# Patient Record
Sex: Male | Born: 1950 | Race: White | Hispanic: No | Marital: Married | Smoking: Former smoker
Health system: Southern US, Community
[De-identification: ages and names within clinical notes are randomized; demographics above are authoritative.]

## PROBLEM LIST (undated history)

## (undated) DIAGNOSIS — N400 Enlarged prostate without lower urinary tract symptoms: Secondary | ICD-10-CM

## (undated) DIAGNOSIS — B029 Zoster without complications: Secondary | ICD-10-CM

## (undated) DIAGNOSIS — I2510 Atherosclerotic heart disease of native coronary artery without angina pectoris: Secondary | ICD-10-CM

## (undated) DIAGNOSIS — Z8719 Personal history of other diseases of the digestive system: Secondary | ICD-10-CM

## (undated) DIAGNOSIS — I10 Essential (primary) hypertension: Secondary | ICD-10-CM

## (undated) DIAGNOSIS — R200 Anesthesia of skin: Secondary | ICD-10-CM

## (undated) DIAGNOSIS — R7303 Prediabetes: Secondary | ICD-10-CM

## (undated) DIAGNOSIS — G4730 Sleep apnea, unspecified: Secondary | ICD-10-CM

## (undated) DIAGNOSIS — K219 Gastro-esophageal reflux disease without esophagitis: Secondary | ICD-10-CM

## (undated) DIAGNOSIS — Z789 Other specified health status: Secondary | ICD-10-CM

## (undated) DIAGNOSIS — IMO0001 Reserved for inherently not codable concepts without codable children: Secondary | ICD-10-CM

## (undated) DIAGNOSIS — K5790 Diverticulosis of intestine, part unspecified, without perforation or abscess without bleeding: Secondary | ICD-10-CM

## (undated) DIAGNOSIS — Z0282 Encounter for adoption services: Secondary | ICD-10-CM

## (undated) DIAGNOSIS — I700 Atherosclerosis of aorta: Secondary | ICD-10-CM

## (undated) DIAGNOSIS — M1990 Unspecified osteoarthritis, unspecified site: Secondary | ICD-10-CM

## (undated) DIAGNOSIS — R011 Cardiac murmur, unspecified: Secondary | ICD-10-CM

## (undated) HISTORY — PX: EYE SURGERY: SHX253

## (undated) HISTORY — PX: KNEE ARTHROPLASTY: SHX992

## (undated) HISTORY — PX: TONSILLECTOMY: SUR1361

## (undated) HISTORY — PX: ROTATOR CUFF REPAIR: SHX139

## (undated) HISTORY — PX: BACK SURGERY: SHX140

## (undated) HISTORY — PX: JOINT REPLACEMENT: SHX530

---

## 2005-11-23 ENCOUNTER — Inpatient Hospital Stay: Payer: Self-pay | Admitting: Internal Medicine

## 2007-03-29 ENCOUNTER — Ambulatory Visit: Payer: Self-pay | Admitting: Orthopaedic Surgery

## 2012-06-13 ENCOUNTER — Ambulatory Visit: Payer: Self-pay | Admitting: Family Medicine

## 2012-09-27 ENCOUNTER — Ambulatory Visit: Payer: Self-pay | Admitting: Unknown Physician Specialty

## 2012-10-28 ENCOUNTER — Ambulatory Visit: Payer: Self-pay | Admitting: Unknown Physician Specialty

## 2012-10-28 ENCOUNTER — Ambulatory Visit: Payer: Self-pay | Admitting: Anesthesiology

## 2012-10-28 LAB — ELECTROLYTE PANEL
Co2: 29 mmol/L (ref 21–32)
Potassium: 3.7 mmol/L (ref 3.5–5.1)
Sodium: 141 mmol/L (ref 136–145)

## 2013-02-13 ENCOUNTER — Ambulatory Visit: Payer: Self-pay | Admitting: Unknown Physician Specialty

## 2013-07-17 DIAGNOSIS — M549 Dorsalgia, unspecified: Secondary | ICD-10-CM | POA: Diagnosis present

## 2014-06-13 DIAGNOSIS — M1712 Unilateral primary osteoarthritis, left knee: Secondary | ICD-10-CM | POA: Diagnosis present

## 2014-10-18 ENCOUNTER — Ambulatory Visit: Payer: Self-pay | Admitting: Unknown Physician Specialty

## 2015-03-07 ENCOUNTER — Other Ambulatory Visit: Payer: Self-pay | Admitting: Unknown Physician Specialty

## 2015-03-07 DIAGNOSIS — R29898 Other symptoms and signs involving the musculoskeletal system: Secondary | ICD-10-CM

## 2015-03-07 DIAGNOSIS — M47812 Spondylosis without myelopathy or radiculopathy, cervical region: Secondary | ICD-10-CM

## 2015-03-14 ENCOUNTER — Ambulatory Visit
Admission: RE | Admit: 2015-03-14 | Discharge: 2015-03-14 | Disposition: A | Discharge status: Home / Self Care | Payer: Managed Care, Other (non HMO) | Source: Ambulatory Visit | Attending: Unknown Physician Specialty | Admitting: Unknown Physician Specialty

## 2015-03-14 DIAGNOSIS — M47892 Other spondylosis, cervical region: Secondary | ICD-10-CM | POA: Diagnosis not present

## 2015-03-14 DIAGNOSIS — M47812 Spondylosis without myelopathy or radiculopathy, cervical region: Secondary | ICD-10-CM

## 2015-03-14 DIAGNOSIS — R200 Anesthesia of skin: Secondary | ICD-10-CM | POA: Diagnosis present

## 2015-03-14 DIAGNOSIS — R29898 Other symptoms and signs involving the musculoskeletal system: Secondary | ICD-10-CM

## 2015-03-14 DIAGNOSIS — M438X2 Other specified deforming dorsopathies, cervical region: Secondary | ICD-10-CM | POA: Diagnosis not present

## 2015-03-14 DIAGNOSIS — M6281 Muscle weakness (generalized): Secondary | ICD-10-CM | POA: Diagnosis present

## 2015-05-23 ENCOUNTER — Other Ambulatory Visit: Payer: Self-pay | Admitting: Neurosurgery

## 2015-07-22 NOTE — Pre-Procedure Instructions (Signed)
    KOUA DEEG  07/22/2015      Your procedure is scheduled on Tuesday, October 11.  Report to [facility] Admitting at 8:05 A.M.   Call this number if you have problems the morning of [phone]                   For any other questions, please call [phone], Monday - Friday 8 AM - 4 PM.   Remember:  Do not eat food or drink liquids after midnight.  Take these medicines the morning of surgery with A SIP OF WATER -    Do not wear jewelry, make-up or nail polish.   Do not wear lotions, powders, or perfume.               Men may shave face and neck.   Do not bring valuables to the hospital.   [facility] is not responsible for any belongings or valuables.  Contacts, dentures or bridgework may not be worn into surgery.  Leave your suitcase in the car.  After surgery it may be brought to your room.  For patients admitted to the hospital, discharge time will be determined by your treatment team.  Patients discharged the day of surgery will not be allowed to drive home.   Name and phone number of your driver:   -  Special instructions: Review  [facility] - Preparing For Surgery.  Please read over the following fact sheets that you were given. Pain Booklet, Coughing and Deep Breathing, Blood Transfusion Information and Surgical Site Infection Prevention

## 2015-07-23 ENCOUNTER — Encounter (HOSPITAL_COMMUNITY)
Admission: RE | Admit: 2015-07-23 | Discharge: 2015-07-23 | Disposition: A | Discharge status: Home / Self Care | Payer: Managed Care, Other (non HMO) | Source: Ambulatory Visit | Attending: Neurosurgery | Admitting: Neurosurgery

## 2015-07-23 ENCOUNTER — Encounter (HOSPITAL_COMMUNITY): Payer: Self-pay

## 2015-07-23 DIAGNOSIS — Z01818 Encounter for other preprocedural examination: Secondary | ICD-10-CM | POA: Insufficient documentation

## 2015-07-23 DIAGNOSIS — M5020 Other cervical disc displacement, unspecified cervical region: Secondary | ICD-10-CM | POA: Diagnosis not present

## 2015-07-23 HISTORY — DX: Gastro-esophageal reflux disease without esophagitis: K21.9

## 2015-07-23 HISTORY — DX: Sleep apnea, unspecified: G47.30

## 2015-07-23 HISTORY — DX: Cardiac murmur, unspecified: R01.1

## 2015-07-23 HISTORY — DX: Essential (primary) hypertension: I10

## 2015-07-23 HISTORY — DX: Reserved for inherently not codable concepts without codable children: IMO0001

## 2015-07-23 LAB — BASIC METABOLIC PANEL
Anion gap: 10 (ref 5–15)
BUN: 19 mg/dL (ref 6–20)
CO2: 24 mmol/L (ref 22–32)
CREATININE: 1.06 mg/dL (ref 0.61–1.24)
Calcium: 9.1 mg/dL (ref 8.9–10.3)
Chloride: 104 mmol/L (ref 101–111)
GFR calc Af Amer: >60 mL/min (ref >60)
GLUCOSE: 181 mg/dL — AB (ref 65–99)
Potassium: 4.3 mmol/L (ref 3.5–5.1)
SODIUM: 138 mmol/L (ref 135–145)

## 2015-07-23 LAB — CBC
HCT: 41.5 % (ref 39.0–52.0)
Hemoglobin: 14.3 g/dL (ref 13.0–17.0)
MCH: 29.3 pg (ref 26.0–34.0)
MCHC: 34.5 g/dL (ref 30.0–36.0)
MCV: 85 fL (ref 78.0–100.0)
Platelets: 322 K/uL (ref 150–400)
RBC: 4.88 MIL/uL (ref 4.22–5.81)
RDW: 13.5 % (ref 11.5–15.5)
WBC: 8.4 K/uL (ref 4.0–10.5)

## 2015-07-23 LAB — SURGICAL PCR SCREEN
MRSA, PCR: NEGATIVE
STAPHYLOCOCCUS AUREUS: NEGATIVE

## 2015-07-23 NOTE — Pre-Procedure Instructions (Signed)
    Joel Owens  07/23/2015      Your procedure is scheduled on Tuesday, October 11.  Report to [facility] Admitting at 8:05 A.M.   Call this number if you have problems the morning of surgery:[phone]                   For any other questions, please call [phone], Monday - Friday 8 AM - 4 PM.   Remember:  Do not eat food or drink liquids after midnight.  Take these medicines the morning of surgery with A SIP OF WATER -   AMLODIPINE (NORVASC), NEXIUM,  FLOMAX    Do not wear jewelry, make-up or nail polish.   Do not wear lotions, powders, or perfume.               Men may shave face and neck.   Do not bring valuables to the hospital.   [facility] is not responsible for any belongings or valuables.  Contacts, dentures or bridgework may not be worn into surgery.  Leave your suitcase in the car.  After surgery it may be brought to your room.  For patients admitted to the hospital, discharge time will be determined by your treatment team.  Patients discharged the day of surgery will not be allowed to drive home.   Name and phone number of your driver:   -  Special instructions: Review  [place] - Preparing For Surgery.  Please read over the following fact sheets that you were given. Pain Booklet, Coughing and Deep Breathing, Blood Transfusion Information and Surgical Site Infection Prevention

## 2015-07-24 NOTE — Progress Notes (Signed)
Anesthesia Chart Review: Patient is a 64 year old male scheduled for C4-5, C5-6, C6-7 ACDF on 10/11/6 by Dr. Venetia Maxon.  History includes former smoker,GERD, childhood murmur ("no problems"), OSA (no CPAP), HTN, exertional dyspnea, bilateral TKA, back surgery. BMI is 39.5 consistent with obesity/borderline morbid obesity. PCP is listed as Dr. Angus Palms ([facility]; see Care Everywhere).  Meds include amlodipine, Nexium, losartan, HCTZ, Zantac, Flomax, tramadol.   07/23/15 EKG: unusual P axis, possible ectopic atrial rhythm, LAD, minimal voltage for LVH, may be normal variant. Since tracing on 10/28/12, low atrial rhythm is new.   Preoperative labs noted. Non-fasting glucose 181. No known DM history. Will order a fasting CBG on arrival to get a better baseline. Further recommendations per surgeon and/or anesthesiologists pending results. Since PAT glucose was < 200 I am anticipating his fasting CBG will be as well.   Above including EKG reviewed with anesthesiologist Dr. Krista Blue. If no acute changes and remains asymptomatic from a CV standpoint then I would anticipate that he could proceed as planned.  Velna Ochs [facility] Short Stay Center/Anesthesiology Phone [phone] 07/24/2015 4:15 PM

## 2015-07-29 MED ORDER — DEXTROSE 5 % IV SOLN
3.0000 g | INTRAVENOUS | Status: AC
  Administered 2015-07-30: 3 g via INTRAVENOUS
  Filled 2015-07-29: qty 3000

## 2015-07-30 ENCOUNTER — Inpatient Hospital Stay (HOSPITAL_COMMUNITY): Payer: Managed Care, Other (non HMO) | Admitting: Emergency Medicine

## 2015-07-30 ENCOUNTER — Inpatient Hospital Stay (HOSPITAL_COMMUNITY): Payer: Managed Care, Other (non HMO) | Admitting: Anesthesiology

## 2015-07-30 ENCOUNTER — Inpatient Hospital Stay (HOSPITAL_COMMUNITY)
Admission: RE | Admit: 2015-07-30 | Discharge: 2015-07-31 | DRG: 473 | Disposition: A | Discharge status: Home / Self Care | Payer: Managed Care, Other (non HMO) | Source: Ambulatory Visit | Attending: Neurosurgery | Admitting: Neurosurgery

## 2015-07-30 ENCOUNTER — Inpatient Hospital Stay (HOSPITAL_COMMUNITY): Payer: Managed Care, Other (non HMO)

## 2015-07-30 ENCOUNTER — Encounter (HOSPITAL_COMMUNITY)
Admission: RE | Disposition: A | Discharge status: Home / Self Care | Payer: Self-pay | Source: Ambulatory Visit | Attending: Neurosurgery

## 2015-07-30 ENCOUNTER — Encounter (HOSPITAL_COMMUNITY): Payer: Self-pay | Admitting: General Practice

## 2015-07-30 DIAGNOSIS — M542 Cervicalgia: Secondary | ICD-10-CM

## 2015-07-30 DIAGNOSIS — M5020 Other cervical disc displacement, unspecified cervical region: Secondary | ICD-10-CM | POA: Diagnosis present

## 2015-07-30 DIAGNOSIS — M47892 Other spondylosis, cervical region: Secondary | ICD-10-CM | POA: Diagnosis present

## 2015-07-30 DIAGNOSIS — Z6839 Body mass index (BMI) 39.0-39.9, adult: Secondary | ICD-10-CM

## 2015-07-30 DIAGNOSIS — I10 Essential (primary) hypertension: Secondary | ICD-10-CM | POA: Diagnosis present

## 2015-07-30 DIAGNOSIS — N400 Enlarged prostate without lower urinary tract symptoms: Secondary | ICD-10-CM | POA: Diagnosis present

## 2015-07-30 DIAGNOSIS — M50121 Cervical disc disorder at C4-C5 level with radiculopathy: Principal | ICD-10-CM | POA: Diagnosis present

## 2015-07-30 DIAGNOSIS — M4802 Spinal stenosis, cervical region: Secondary | ICD-10-CM | POA: Diagnosis present

## 2015-07-30 DIAGNOSIS — Z96653 Presence of artificial knee joint, bilateral: Secondary | ICD-10-CM | POA: Diagnosis present

## 2015-07-30 DIAGNOSIS — M50123 Cervical disc disorder at C6-C7 level with radiculopathy: Secondary | ICD-10-CM | POA: Diagnosis present

## 2015-07-30 HISTORY — PX: ANTERIOR CERVICAL DECOMP/DISCECTOMY FUSION: SHX1161

## 2015-07-30 LAB — GLUCOSE, CAPILLARY: Glucose-Capillary: 127 mg/dL — ABNORMAL HIGH (ref 65–99)

## 2015-07-30 SURGERY — ANTERIOR CERVICAL DECOMPRESSION/DISCECTOMY FUSION 3 LEVELS
Anesthesia: General | Site: Neck

## 2015-07-30 MED ORDER — HYDROMORPHONE HCL 1 MG/ML IJ SOLN: 0.5000 mg | INTRAMUSCULAR | Status: DC | PRN

## 2015-07-30 MED ORDER — DEXAMETHASONE SODIUM PHOSPHATE 10 MG/ML IJ SOLN
INTRAMUSCULAR | Status: DC | PRN
  Administered 2015-07-30: 10 mg via INTRAVENOUS

## 2015-07-30 MED ORDER — FENTANYL CITRATE (PF) 250 MCG/5ML IJ SOLN
INTRAMUSCULAR | Status: DC | PRN
  Administered 2015-07-30: 50 ug via INTRAVENOUS
  Administered 2015-07-30: 100 ug via INTRAVENOUS
  Administered 2015-07-30: 50 ug via INTRAVENOUS
  Administered 2015-07-30: 150 ug via INTRAVENOUS
  Administered 2015-07-30: 50 ug via INTRAVENOUS

## 2015-07-30 MED ORDER — SODIUM CHLORIDE 0.9 % IJ SOLN
3.0000 mL | Freq: Two times a day (BID) | INTRAMUSCULAR | Status: DC
  Administered 2015-07-30: 3 mL via INTRAVENOUS

## 2015-07-30 MED ORDER — METHOCARBAMOL 500 MG PO TABS
500.0000 mg | ORAL_TABLET | Freq: Four times a day (QID) | ORAL | Status: DC | PRN
  Administered 2015-07-30 – 2015-07-31 (×2): 500 mg via ORAL
  Filled 2015-07-30: qty 1

## 2015-07-30 MED ORDER — ONDANSETRON HCL 4 MG/2ML IJ SOLN
INTRAMUSCULAR | Status: DC | PRN
  Administered 2015-07-30: 4 mg via INTRAVENOUS

## 2015-07-30 MED ORDER — PROPOFOL 10 MG/ML IV BOLUS
INTRAVENOUS | Status: DC | PRN
  Administered 2015-07-30: 180 mg via INTRAVENOUS

## 2015-07-30 MED ORDER — GLYCOPYRROLATE 0.2 MG/ML IJ SOLN
INTRAMUSCULAR | Status: AC
  Filled 2015-07-30: qty 2

## 2015-07-30 MED ORDER — ACETAMINOPHEN 325 MG PO TABS: 650.0000 mg | ORAL_TABLET | ORAL | Status: DC | PRN

## 2015-07-30 MED ORDER — ADULT MULTIVITAMIN W/MINERALS CH
1.0000 | ORAL_TABLET | Freq: Every day | ORAL | Status: DC
  Filled 2015-07-30: qty 1

## 2015-07-30 MED ORDER — HYDROCODONE-ACETAMINOPHEN 5-325 MG PO TABS: 1.0000 | ORAL_TABLET | ORAL | Status: DC | PRN

## 2015-07-30 MED ORDER — CEFAZOLIN SODIUM 10 G IJ SOLR
3.0000 g | Freq: Three times a day (TID) | INTRAMUSCULAR | Status: AC
  Administered 2015-07-30 (×2): 3 g via INTRAVENOUS
  Filled 2015-07-30 (×3): qty 3000

## 2015-07-30 MED ORDER — PHENOL 1.4 % MT LIQD: 1.0000 | OROMUCOSAL | Status: DC | PRN

## 2015-07-30 MED ORDER — OXYCODONE HCL 5 MG PO TABS
5.0000 mg | ORAL_TABLET | Freq: Once | ORAL | Status: AC | PRN
  Administered 2015-07-30: 5 mg via ORAL

## 2015-07-30 MED ORDER — ONDANSETRON HCL 4 MG/2ML IJ SOLN: 4.0000 mg | INTRAMUSCULAR | Status: DC | PRN

## 2015-07-30 MED ORDER — MIDAZOLAM HCL 2 MG/2ML IJ SOLN
INTRAMUSCULAR | Status: AC
  Filled 2015-07-30: qty 4

## 2015-07-30 MED ORDER — ACETAMINOPHEN 160 MG/5ML PO SOLN
325.0000 mg | ORAL | Status: DC | PRN
  Filled 2015-07-30: qty 20.3

## 2015-07-30 MED ORDER — FAMOTIDINE 20 MG PO TABS
10.0000 mg | ORAL_TABLET | Freq: Two times a day (BID) | ORAL | Status: DC
  Filled 2015-07-30: qty 1

## 2015-07-30 MED ORDER — METHOCARBAMOL 500 MG PO TABS
ORAL_TABLET | ORAL | Status: AC
  Filled 2015-07-30: qty 1

## 2015-07-30 MED ORDER — METHOCARBAMOL 1000 MG/10ML IJ SOLN
500.0000 mg | Freq: Four times a day (QID) | INTRAVENOUS | Status: DC | PRN
  Filled 2015-07-30: qty 5

## 2015-07-30 MED ORDER — MIDAZOLAM HCL 2 MG/2ML IJ SOLN
INTRAMUSCULAR | Status: AC
  Filled 2015-07-30: qty 2

## 2015-07-30 MED ORDER — ROCURONIUM BROMIDE 100 MG/10ML IV SOLN
INTRAVENOUS | Status: DC | PRN
  Administered 2015-07-30: 50 mg via INTRAVENOUS

## 2015-07-30 MED ORDER — FENTANYL CITRATE (PF) 250 MCG/5ML IJ SOLN
INTRAMUSCULAR | Status: AC
  Filled 2015-07-30: qty 5

## 2015-07-30 MED ORDER — OXYCODONE HCL 5 MG PO TABS
ORAL_TABLET | ORAL | Status: AC
  Filled 2015-07-30: qty 1

## 2015-07-30 MED ORDER — ONDANSETRON HCL 4 MG/2ML IJ SOLN
INTRAMUSCULAR | Status: AC
  Filled 2015-07-30: qty 2

## 2015-07-30 MED ORDER — ACETAMINOPHEN 650 MG RE SUPP: 650.0000 mg | RECTAL | Status: DC | PRN

## 2015-07-30 MED ORDER — POLYETHYLENE GLYCOL 3350 17 G PO PACK: 17.0000 g | PACK | Freq: Every day | ORAL | Status: DC | PRN

## 2015-07-30 MED ORDER — BISACODYL 10 MG RE SUPP: 10.0000 mg | Freq: Every day | RECTAL | Status: DC | PRN

## 2015-07-30 MED ORDER — ALUM & MAG HYDROXIDE-SIMETH 200-200-20 MG/5ML PO SUSP
30.0000 mL | Freq: Four times a day (QID) | ORAL | Status: DC | PRN
  Administered 2015-07-31: 30 mL via ORAL
  Filled 2015-07-30: qty 30

## 2015-07-30 MED ORDER — DOCUSATE SODIUM 100 MG PO CAPS
100.0000 mg | ORAL_CAPSULE | Freq: Two times a day (BID) | ORAL | Status: DC
  Filled 2015-07-30: qty 1

## 2015-07-30 MED ORDER — SODIUM CHLORIDE 0.9 % IJ SOLN: 3.0000 mL | INTRAMUSCULAR | Status: DC | PRN

## 2015-07-30 MED ORDER — VITAMIN D 1000 UNITS PO TABS
5000.0000 [IU] | ORAL_TABLET | Freq: Every day | ORAL | Status: DC
  Filled 2015-07-30: qty 5

## 2015-07-30 MED ORDER — MENTHOL 3 MG MT LOZG: 1.0000 | LOZENGE | OROMUCOSAL | Status: DC | PRN

## 2015-07-30 MED ORDER — MIDAZOLAM HCL 5 MG/5ML IJ SOLN
INTRAMUSCULAR | Status: DC | PRN
  Administered 2015-07-30: 2 mg via INTRAVENOUS

## 2015-07-30 MED ORDER — LIDOCAINE HCL (CARDIAC) 20 MG/ML IV SOLN
INTRAVENOUS | Status: DC | PRN
  Administered 2015-07-30: 80 mg via INTRAVENOUS

## 2015-07-30 MED ORDER — VITAMIN C 500 MG PO TABS
500.0000 mg | ORAL_TABLET | Freq: Every day | ORAL | Status: DC
  Filled 2015-07-30 (×2): qty 1

## 2015-07-30 MED ORDER — LOSARTAN POTASSIUM 50 MG PO TABS
50.0000 mg | ORAL_TABLET | Freq: Every day | ORAL | Status: DC
  Administered 2015-07-30 – 2015-07-31 (×2): 50 mg via ORAL
  Filled 2015-07-30 (×2): qty 1

## 2015-07-30 MED ORDER — TAMSULOSIN HCL 0.4 MG PO CAPS
0.4000 mg | ORAL_CAPSULE | Freq: Every day | ORAL | Status: DC
  Administered 2015-07-31: 0.4 mg via ORAL
  Filled 2015-07-30: qty 1

## 2015-07-30 MED ORDER — NEOSTIGMINE METHYLSULFATE 10 MG/10ML IV SOLN
INTRAVENOUS | Status: AC
  Filled 2015-07-30: qty 1

## 2015-07-30 MED ORDER — ACETAMINOPHEN 325 MG PO TABS: 325.0000 mg | ORAL_TABLET | ORAL | Status: DC | PRN

## 2015-07-30 MED ORDER — ZOLPIDEM TARTRATE 5 MG PO TABS: 5.0000 mg | ORAL_TABLET | Freq: Every evening | ORAL | Status: DC | PRN

## 2015-07-30 MED ORDER — LIDOCAINE-EPINEPHRINE 1 %-1:100000 IJ SOLN
INTRAMUSCULAR | Status: DC | PRN
  Administered 2015-07-30: 5 mL

## 2015-07-30 MED ORDER — NEOSTIGMINE METHYLSULFATE 10 MG/10ML IV SOLN
INTRAVENOUS | Status: DC | PRN
  Administered 2015-07-30: 3 mg via INTRAVENOUS

## 2015-07-30 MED ORDER — PANTOPRAZOLE SODIUM 40 MG PO TBEC
40.0000 mg | DELAYED_RELEASE_TABLET | Freq: Every day | ORAL | Status: DC
  Administered 2015-07-31: 40 mg via ORAL
  Filled 2015-07-30 (×2): qty 1

## 2015-07-30 MED ORDER — HYDROMORPHONE HCL 1 MG/ML IJ SOLN
INTRAMUSCULAR | Status: AC
  Filled 2015-07-30: qty 1

## 2015-07-30 MED ORDER — PROPOFOL 10 MG/ML IV BOLUS
INTRAVENOUS | Status: AC
  Filled 2015-07-30: qty 20

## 2015-07-30 MED ORDER — TRIAMCINOLONE ACETONIDE 0.1 % EX CREA
1.0000 "application " | TOPICAL_CREAM | Freq: Two times a day (BID) | CUTANEOUS | Status: DC | PRN
  Filled 2015-07-30: qty 15

## 2015-07-30 MED ORDER — 0.9 % SODIUM CHLORIDE (POUR BTL) OPTIME
TOPICAL | Status: DC | PRN
  Administered 2015-07-30: 1000 mL

## 2015-07-30 MED ORDER — PANTOPRAZOLE SODIUM 40 MG IV SOLR: 40.0000 mg | Freq: Every evening | INTRAVENOUS | Status: DC | PRN

## 2015-07-30 MED ORDER — THROMBIN 5000 UNITS EX SOLR
CUTANEOUS | Status: DC | PRN
  Administered 2015-07-30: 10 mL via TOPICAL

## 2015-07-30 MED ORDER — HYDROMORPHONE HCL 1 MG/ML IJ SOLN
0.2500 mg | INTRAMUSCULAR | Status: DC | PRN
  Administered 2015-07-30 (×4): 0.5 mg via INTRAVENOUS

## 2015-07-30 MED ORDER — HYDROCHLOROTHIAZIDE 25 MG PO TABS
25.0000 mg | ORAL_TABLET | Freq: Every day | ORAL | Status: DC
  Administered 2015-07-30 – 2015-07-31 (×2): 25 mg via ORAL
  Filled 2015-07-30 (×2): qty 1

## 2015-07-30 MED ORDER — OXYCODONE HCL 5 MG/5ML PO SOLN: 5.0000 mg | Freq: Once | ORAL | Status: AC | PRN

## 2015-07-30 MED ORDER — GLYCOPYRROLATE 0.2 MG/ML IJ SOLN
INTRAMUSCULAR | Status: DC | PRN
  Administered 2015-07-30: 0.4 mg via INTRAVENOUS

## 2015-07-30 MED ORDER — OXYCODONE-ACETAMINOPHEN 5-325 MG PO TABS
1.0000 | ORAL_TABLET | ORAL | Status: DC | PRN
  Administered 2015-07-30 – 2015-07-31 (×3): 2 via ORAL
  Filled 2015-07-30 (×3): qty 2

## 2015-07-30 MED ORDER — LACTATED RINGERS IV SOLN
INTRAVENOUS | Status: DC
  Administered 2015-07-30 (×2): via INTRAVENOUS

## 2015-07-30 MED ORDER — THROMBIN 20000 UNITS EX SOLR
CUTANEOUS | Status: DC | PRN
  Administered 2015-07-30: 20 mL via TOPICAL

## 2015-07-30 MED ORDER — BUPIVACAINE HCL (PF) 0.5 % IJ SOLN
INTRAMUSCULAR | Status: DC | PRN
  Administered 2015-07-30: 5 mL

## 2015-07-30 MED ORDER — FLEET ENEMA 7-19 GM/118ML RE ENEM: 1.0000 | ENEMA | Freq: Once | RECTAL | Status: DC | PRN

## 2015-07-30 MED ORDER — TRAMADOL HCL 50 MG PO TABS: 50.0000 mg | ORAL_TABLET | ORAL | Status: DC | PRN

## 2015-07-30 MED ORDER — KCL IN DEXTROSE-NACL 20-5-0.45 MEQ/L-%-% IV SOLN
INTRAVENOUS | Status: DC
  Filled 2015-07-30 (×3): qty 1000

## 2015-07-30 MED ORDER — AMLODIPINE BESYLATE 10 MG PO TABS
10.0000 mg | ORAL_TABLET | Freq: Every day | ORAL | Status: DC
  Filled 2015-07-30: qty 1

## 2015-07-30 SURGICAL SUPPLY — 70 items
BENZOIN TINCTURE PRP APPL 2/3 (GAUZE/BANDAGES/DRESSINGS) IMPLANT
BIT DRILL NEURO 2X3.1 SFT TUCH (MISCELLANEOUS) ×1 IMPLANT
BIT DRILL POWER (BIT) ×1 IMPLANT
BLADE ULTRA TIP 2M (BLADE) IMPLANT
BNDG GAUZE ELAST 4 BULKY (GAUZE/BANDAGES/DRESSINGS) IMPLANT
BUR BARREL STRAIGHT FLUTE 4.0 (BURR) ×3 IMPLANT
CAGE SMALL 7X13X15 (Cage) ×9 IMPLANT
CANISTER SUCT 3000ML PPV (MISCELLANEOUS) ×3 IMPLANT
CLOSURE WOUND 1/2 X4 (GAUZE/BANDAGES/DRESSINGS)
COVER MAYO STAND STRL (DRAPES) ×3 IMPLANT
DECANTER SPIKE VIAL GLASS SM (MISCELLANEOUS) ×3 IMPLANT
DERMABOND ADHESIVE PROPEN (GAUZE/BANDAGES/DRESSINGS) ×2
DERMABOND ADVANCED (GAUZE/BANDAGES/DRESSINGS) ×2
DERMABOND ADVANCED .7 DNX12 (GAUZE/BANDAGES/DRESSINGS) ×1 IMPLANT
DERMABOND ADVANCED .7 DNX6 (GAUZE/BANDAGES/DRESSINGS) ×1 IMPLANT
DRAPE LAPAROTOMY 100X72 PEDS (DRAPES) ×3 IMPLANT
DRAPE MICROSCOPE LEICA (MISCELLANEOUS) ×3 IMPLANT
DRAPE POUCH INSTRU U-SHP 10X18 (DRAPES) ×3 IMPLANT
DRAPE PROXIMA HALF (DRAPES) IMPLANT
DRILL BIT POWER (BIT) ×2
DRILL NEURO 2X3.1 SOFT TOUCH (MISCELLANEOUS) ×3
DURAPREP 6ML APPLICATOR 50/CS (WOUND CARE) ×3 IMPLANT
ELECT COATED BLADE 2.86 ST (ELECTRODE) ×3 IMPLANT
ELECT REM PT RETURN 9FT ADLT (ELECTROSURGICAL) ×3
ELECTRODE REM PT RTRN 9FT ADLT (ELECTROSURGICAL) ×1 IMPLANT
GAUZE SPONGE 4X4 12PLY STRL (GAUZE/BANDAGES/DRESSINGS) IMPLANT
GAUZE SPONGE 4X4 16PLY XRAY LF (GAUZE/BANDAGES/DRESSINGS) IMPLANT
GLOVE BIO SURGEON STRL SZ8 (GLOVE) ×3 IMPLANT
GLOVE BIOGEL PI IND STRL 7.5 (GLOVE) ×1 IMPLANT
GLOVE BIOGEL PI IND STRL 8 (GLOVE) ×1 IMPLANT
GLOVE BIOGEL PI IND STRL 8.5 (GLOVE) ×1 IMPLANT
GLOVE BIOGEL PI INDICATOR 7.5 (GLOVE) ×2
GLOVE BIOGEL PI INDICATOR 8 (GLOVE) ×2
GLOVE BIOGEL PI INDICATOR 8.5 (GLOVE) ×2
GLOVE ECLIPSE 7.5 STRL STRAW (GLOVE) ×3 IMPLANT
GLOVE ECLIPSE 8.0 STRL XLNG CF (GLOVE) ×6 IMPLANT
GLOVE EXAM NITRILE LRG STRL (GLOVE) IMPLANT
GLOVE EXAM NITRILE MD LF STRL (GLOVE) IMPLANT
GLOVE EXAM NITRILE XL STR (GLOVE) IMPLANT
GLOVE EXAM NITRILE XS STR PU (GLOVE) IMPLANT
GOWN STRL REUS W/ TWL LRG LVL3 (GOWN DISPOSABLE) IMPLANT
GOWN STRL REUS W/ TWL XL LVL3 (GOWN DISPOSABLE) ×2 IMPLANT
GOWN STRL REUS W/TWL 2XL LVL3 (GOWN DISPOSABLE) ×3 IMPLANT
GOWN STRL REUS W/TWL LRG LVL3 (GOWN DISPOSABLE)
GOWN STRL REUS W/TWL XL LVL3 (GOWN DISPOSABLE) ×4
HALTER HD/CHIN CERV TRACTION D (MISCELLANEOUS) ×3 IMPLANT
KIT BASIN OR (CUSTOM PROCEDURE TRAY) ×3 IMPLANT
KIT ROOM TURNOVER OR (KITS) ×3 IMPLANT
NEEDLE HYPO 25X1 1.5 SAFETY (NEEDLE) ×3 IMPLANT
NEEDLE SPNL 18GX3.5 QUINCKE PK (NEEDLE) IMPLANT
NEEDLE SPNL 22GX3.5 QUINCKE BK (NEEDLE) ×3 IMPLANT
NS IRRIG 1000ML POUR BTL (IV SOLUTION) ×3 IMPLANT
PACK LAMINECTOMY NEURO (CUSTOM PROCEDURE TRAY) ×3 IMPLANT
PAD ARMBOARD 7.5X6 YLW CONV (MISCELLANEOUS) ×9 IMPLANT
PIN DISTRACTION 14MM (PIN) ×6 IMPLANT
PLATE ARCHON 3-LEVEL 54MM (Plate) ×3 IMPLANT
PUTTY BONE DBX 2.5 MIS (Bone Implant) ×3 IMPLANT
RUBBERBAND STERILE (MISCELLANEOUS) ×6 IMPLANT
SCREW ARCHON SELFTAP 4.0X13 (Screw) ×24 IMPLANT
SPONGE INTESTINAL PEANUT (DISPOSABLE) ×3 IMPLANT
SPONGE SURGIFOAM ABS GEL 100 (HEMOSTASIS) ×3 IMPLANT
STAPLER SKIN PROX WIDE 3.9 (STAPLE) IMPLANT
STRIP CLOSURE SKIN 1/2X4 (GAUZE/BANDAGES/DRESSINGS) IMPLANT
SUT VIC AB 3-0 SH 8-18 (SUTURE) ×3 IMPLANT
SYR 5ML LL (SYRINGE) IMPLANT
TOWEL OR 17X24 6PK STRL BLUE (TOWEL DISPOSABLE) ×3 IMPLANT
TOWEL OR 17X26 10 PK STRL BLUE (TOWEL DISPOSABLE) ×3 IMPLANT
TRAP SPECIMEN MUCOUS 40CC (MISCELLANEOUS) IMPLANT
TRAY FOLEY W/METER SILVER 14FR (SET/KITS/TRAYS/PACK) IMPLANT
WATER STERILE IRR 1000ML POUR (IV SOLUTION) ×3 IMPLANT

## 2015-07-30 NOTE — Anesthesia Procedure Notes (Signed)
Procedure Name: Intubation Date/Time: 07/30/2015 10:58 AM Performed by: Jerilee Hoh Pre-anesthesia Checklist: Patient identified, Emergency Drugs available, Suction available and Patient being monitored Patient Re-evaluated:Patient Re-evaluated prior to inductionOxygen Delivery Method: Circle system utilized Preoxygenation: Pre-oxygenation with 100% oxygen Intubation Type: IV induction Ventilation: Mask ventilation without difficulty and Oral airway inserted - appropriate to patient size Laryngoscope Size: Mac and 4 Grade View: Grade I Tube type: Oral Tube size: 7.5 mm Number of attempts: 1 Airway Equipment and Method: Stylet Placement Confirmation: ETT inserted through vocal cords under direct vision,  positive ETCO2 and breath sounds checked- equal and bilateral Secured at: 23 cm Tube secured with: Tape Dental Injury: Teeth and Oropharynx as per pre-operative assessment

## 2015-07-30 NOTE — Anesthesia Postprocedure Evaluation (Signed)
  Anesthesia Post-op Note  Patient: Joel Owens  Procedure(s) Performed: Procedure(s) with comments: C4-5 C5-6 C6-7 Anterior cervical decompression/diskectomy/fusion (N/A) - C4-5 C5-6 C6-7 Anterior cervical decompression/diskectomy/fusion  Patient Location: PACU  Anesthesia Type:General  Level of Consciousness: awake  Airway and Oxygen Therapy: Patient Spontanous Breathing and Patient connected to nasal cannula oxygen  Post-op Pain: mild  Post-op Assessment: Post-op Vital signs reviewed, Patient's Cardiovascular Status Stable, Respiratory Function Stable, Patent Airway, No signs of Nausea or vomiting and Pain level controlled              Post-op Vital Signs: Reviewed and stable  Last Vitals:  Filed Vitals:   07/30/15 1535  BP:   Pulse: 100  Temp: 37.6 C  Resp: 22    Complications: No apparent anesthesia complications

## 2015-07-30 NOTE — Interval H&P Note (Signed)
History and Physical Interval Note:  07/30/2015 7:24 AM  Joel Owens  has presented today for surgery, with the diagnosis of Cervical herniated disc  The various methods of treatment have been discussed with the patient and family. After consideration of risks, benefits and other options for treatment, the patient has consented to  Procedure(s) with comments: C4-5 C5-6 C6-7 Anterior cervical decompression/diskectomy/fusion (N/A) - C4-5 C5-6 C6-7 Anterior cervical decompression/diskectomy/fusion as a surgical intervention .  The patient's history has been reviewed, patient examined, no change in status, stable for surgery.  I have reviewed the patient's chart and labs.  Questions were answered to the patient's satisfaction.     Adelle Zachar D

## 2015-07-30 NOTE — Op Note (Signed)
07/30/2015  2:15 PM  PATIENT:  Joel Owens  64 y.o. male  PRE-OPERATIVE DIAGNOSIS:  Cervical herniated disc with spondylosis, stenosis, radiculopathy C 45, C 56, C 67 levels  POST-OPERATIVE DIAGNOSIS:  Cervical herniated disc with spondylosis, stenosis, radiculopathy C 45, C 56, C 67 levels  PROCEDURE:  Procedure(s) with comments: C4-5 C5-6 C6-7 Anterior cervical decompression/diskectomy/fusion (N/A) - C4-5 C5-6 C6-7 Anterior cervical decompression/diskectomy/fusion with PEEK cages, autograft, anterior cervical plate  SURGEON:  Surgeon(s) and Role:    * Maeola Harman, MD - Primary    * Julio Sicks, MD - Assisting  PHYSICIAN ASSISTANT:   ASSISTANTS: Poteat, RN   ANESTHESIA:   general  EBL:  Total I/O In: 1500 [I.V.:1500] Out: 100 [Blood:100]  BLOOD ADMINISTERED:none  DRAINS: (10) Jackson-Pratt drain(s) with closed bulb suction in the prevertebral space   LOCAL MEDICATIONS USED:  LIDOCAINE   SPECIMEN:  No Specimen  DISPOSITION OF SPECIMEN:  N/A  COUNTS:  YES  TOURNIQUET:  * No tourniquets in log *  DICTATION: DICTATION: Patient was brought to operating room and following the smooth and uncomplicated induction of general endotracheal anesthesia his head was placed on a horseshoe head holder he was placed in 5 pounds of Holter traction and his anterior neck was prepped and draped in usual sterile fashion. An incision was made on the left side of midline after infiltrating the skin and subcutaneous tissues with local lidocaine. The platysmal layer was incised and subplatysmal dissection was performed exposing the anterior border sternocleidomastoid muscle. Using blunt dissection the carotid sheath was kept lateral and trachea and esophagus kept medial exposing the anterior cervical spine. A bent spinal needle was placed it was felt to be the C4-5 and C5-6 levels and this was confirmed on intraoperative x-ray. Longus coli muscles were taken down from the anterior cervical  spine using electrocautery and key elevator and self-retaining retractor was placed. The interspace at C6-7 was incised and a thorough discectomy was performed. Distraction pins were placed. Uncinate spurs and central spondylitic ridges were drilled down with a high-speed drill. The spinal cord dura and both C7 nerve roots were widely decompressed. There was a considerable amount of spondylotic material which was carefully removed.  Hemostasis was assured. After trial sizing a 7 mm peek interbody cage was selected and packed with morcellized autograft and DBM. The graft was tamped into position and countersunk appropriately. The retractor was moved and the interspace at C5-6 was incised and a thorough discectomy was performed. Distraction pins were placed. Uncinate spurs and central spondylitic ridges were drilled down with a high-speed drill. The spinal cord dura and both C6 nerve roots were widely decompressed. Hemostasis was assured. After trial sizing an 7 mm peek interbody cage was selected and packed in a similar fashiont. The graft was tamped into position and countersunk appropriately.The interspace at C4-5 was incised and a thorough discectomy was performed. Distraction pins were placed. Uncinate spurs and central spondylitic ridges were drilled down with a high-speed drill. The spinal cord dura and both C5 nerve roots were widely decompressed. Hemostasis was assured. After trial sizing a 7 mm peek interbody cage was selected and packed in a similar fashion. The graft was tamped into position and countersunk appropriately.  Distraction weight was removed. A 54 mm Nuvasive Archon anterior cervical plate was affixed to the cervical spine with 13 mm variable-angle screws 2 at C4, 2 at C5, 2 at C6,  and 2 at C7. All screws were well-positioned and locking mechanisms were engaged.  Soft tissues were inspected and found to be in good repair. The wound was irrigated. A final x-ray was obtained with good  visualization at C4 with the interbody graft well visualized. A 10 JP drain was placed. The platysma layer was closed with 3-0 Vicryl stitches and the skin was reapproximated with 3-0 Vicryl subcuticular stitches. The wound was dressed with Dermabond and a sterile occlusive dressing. Counts were correct at the end of the case. Patient was extubated and taken to recovery in stable and satisfactory condition.    PLAN OF CARE: Admit to inpatient   PATIENT DISPOSITION:  PACU - hemodynamically stable.   Delay start of Pharmacological VTE agent (>24hrs) due to surgical blood loss or risk of bleeding: yes  

## 2015-07-30 NOTE — H&P (Signed)
Patient ID:   [id number] Patient: Joel Owens  Date of Birth: [birth year] Visit Type: Office Visit   Date: 05/22/2015 03:30 PM Provider: Danae Orleans. Venetia Maxon MD   This 64 year old male presents for neck pain.  History of Present Illness: 1.  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  Patient recalls lifting a heavy sheet of plywood in October and feeling a pop in his neck.  Since that time he notices while holding objects his arms will just "give way".  He reports only occasional aches in his neck and both arms. He notes one episode of dizziness this morning while driving to work has subsided.   History: HTN, enlarged prostate Surgical history: Lumbar surgery 1980s, left shoulder 2012, right partial knee replacement 2014, left partial knee replacement January 2016  MRI on [place]  MRI was reviewed which shows significant spondylosis and stenosis at the C4 C5, C5 C6, C6 C7 levels.  There appears to be foraminal stenosis as well as central spinal stenosis at these levels.  The patient describes significant neck pain and also numbness and dropping of his arms due to weakness.       PAST MEDICAL HISTORY, SURGICAL HISTORY, FAMILY HISTORY, SOCIAL HISTORY AND REVIEW OF SYSTEMS I have reviewed the patient's past medical, surgical, family and social history as well as the comprehensive review of systems as included on the [facility] history form dated 05/22/2015, which I have signed.   MEDICATIONS(added, continued or stopped this visit):   ALLERGIES: Ingredient Reaction Medication Name Comment  NO KNOWN ALLERGIES     No known allergies.   REVIEW OF SYSTEMS System Neg/Pos Details  Constitutional Negative Chills, fatigue, fever, malaise, night sweats, weight gain and weight loss.  ENMT Negative Ear drainage, hearing loss, nasal drainage,  otalgia, sinus pressure and sore throat.  Eyes Negative Eye discharge, eye pain and vision changes.  Respiratory Negative Chronic cough, cough, dyspnea, known TB exposure and wheezing.  Cardio Negative Chest pain, claudication, edema and irregular heartbeat/palpitations.  GI Negative Abdominal pain, blood in stool, change in stool pattern, constipation, decreased appetite, diarrhea, heartburn, nausea and vomiting.  GU Negative Dribbling, dysuria, erectile dysfunction, hematuria, polyuria, slow stream, urinary frequency, urinary incontinence and urinary retention.  Endocrine Negative Cold intolerance, heat intolerance, polydipsia and polyphagia.  Neuro Negative Dizziness, extremity weakness, gait disturbance, headache, memory impairment, numbness in extremity, seizures and tremors.  Psych Negative Anxiety, depression and insomnia.  Integumentary Negative Brittle hair, brittle nails, change in shape/size of mole(s), hair loss, hirsutism, hives, pruritus, rash and skin lesion.  MS Positive Muscle weakness, Neck pain.  Hema/Lymph Negative Easy bleeding, easy bruising and lymphadenopathy.  Allergic/Immuno Negative Contact allergy, environmental allergies, food allergies and seasonal allergies.  Reproductive Negative Penile discharge and sexual dysfunction.     Vitals Date Temp F BP Pulse Ht In Wt Lb BMI BSA Pain Score  05/22/2015  158/80 88 70 282 40.46  2/10     PHYSICAL EXAM General Level of Distress: no acute distress Overall Appearance: normal    Cardiovascular Cardiac: regular rate and rhythm without murmur  Right Left  Carotid Pulses: normal normal  Respiratory Lungs: clear to auscultation  Neurological Orientation: normal Recent and Remote Memory: normal Attention Span and Concentration:   normal Language: normal Fund of Knowledge: normal  Right Left Sensation: normal normal Upper Extremity Coordination: normal normal  Lower Extremity  Coordination: normal normal  Musculoskeletal Gait and Station: normal  Right Left Upper Extremity Muscle Strength: normal normal Lower Extremity Muscle Strength: normal normal Upper Extremity Muscle Tone:  normal normal Lower Extremity Muscle Tone: normal normal  Motor Strength Upper and lower extremity motor strength was tested in the clinically pertinent muscles. Any abnormal findings will be noted below.   Right Left Deltoid: 4+/5 4+/5 Biceps: 4+/5 4+/5 Triceps: 4/5 4+/5 Grip: 4/5 4/5   Deep Tendon Reflexes  Right Left Biceps: normal normal Triceps: normal normal Brachiloradialis: normal normal Patellar: normal normal Achilles: normal normal  Sensory Sensation was tested at C2 to T1.   Cranial Nerves II. Optic Nerve/Visual Fields: normal III. Oculomotor: normal IV. Trochlear: normal V. Trigeminal: normal VI. Abducens: normal VII. Facial: normal VIII. Acoustic/Vestibular: normal IX. Glossopharyngeal: normal X. Vagus: normal XI. Spinal Accessory: normal XII. Hypoglossal: normal  Motor and other Tests Lhermittes: negative Rhomberg: negative Pronator drift: absent     Right Left Spurlings positive positive Hoffman's: normal normal Clonus: normal normal Babinski: normal normal SLR: negative negative Patrick's Pearlean Brownie): negative negative Toe Walk: normal normal Toe Lift: normal normal Heel Walk: normal normal Tinels Elbow: negative negative Tinels Wrist: negative negative Phalen: negative negative   Additional Findings:  Patient notes tingling into both of his hands but denies numbness to pinprick.    IMPRESSION Patient has cervical stenosis, cord compression, foraminal stenosis affecting the C4 C5, C5 C6, C6 C7 levels.  I recommended that he undergo anterior cervical decompression fusion at the C4 C5, C5 C6, C6 C7 levels.  Wishes to do so.  I do not believe there is a nonsurgical option.  Assessment/Plan # Detail Type Description   1.  Assessment Radiculopathy, cervical region (M54.12).       2. Assessment Cervicalgia (M54.2).       3. Assessment Herniated nucleus pulposus, cervical (M50.20).       4. Assessment Cervical stenosis of spinal canal (M48.02).         Pain Assessment/Treatment Location: neck. Onset: 07/21/2014. Duration: varies. Quality: discomforting. Pain Assessment/Treatment follow-up plan of care: Patient is taking medications as prescribed..  Anterior cervical decompression and fusion C4 C5, C5 C6, C6 C7 levels.  Risks and benefits were discussed in detail with the patient.  Models were reviewed and the surgery and specific was also reviewed in detail.  The patient was fitted for a soft cervical collar.  Orders: Diagnostic Procedures: Assessment Procedure  M50.20 ACDF - C4-C5 - C5-C6 - C6-C7  M50.20 Cervical Spine- Lateral             Provider:  Danae Orleans. Venetia Maxon MD  05/25/2015 02:53 PM Dictation edited by: Joel Owens    CC Providers: [facility] [id number] [place], [place] [address]-              Electronically signed by Joel Orleans. Venetia Maxon MD on 05/25/2015 02:53 PM

## 2015-07-30 NOTE — Progress Notes (Signed)
Awake, alert, conversant.  MAEW with full strength bilateral D/B/T.  Doing well.

## 2015-07-30 NOTE — Anesthesia Preprocedure Evaluation (Signed)
Anesthesia Evaluation  Patient identified by MRN, date of birth, ID band Patient awake    Reviewed: Allergy & Precautions, NPO status , Patient's Chart, lab work & pertinent test results  History of Anesthesia Complications Negative for: history of anesthetic complications  Airway Mallampati: II  TM Distance: >3 FB Neck ROM: Full    Dental  (+) Teeth Intact, Chipped   Pulmonary neg shortness of breath, sleep apnea , neg COPD, former smoker,    breath sounds clear to auscultation       Cardiovascular hypertension, Pt. on medications (-) angina(-) Past MI and (-) CHF  Rhythm:Regular     Neuro/Psych  Neuromuscular disease negative psych ROS   GI/Hepatic Neg liver ROS, GERD  Medicated and Controlled,  Endo/Other  Morbid obesity  Renal/GU negative Renal ROS     Musculoskeletal   Abdominal   Peds  Hematology negative hematology ROS (+)   Anesthesia Other Findings   Reproductive/Obstetrics                             Anesthesia Physical Anesthesia Plan  ASA: II  Anesthesia Plan: General   Post-op Pain Management:    Induction: Intravenous  Airway Management Planned: Oral ETT  Additional Equipment: None  Intra-op Plan:   Post-operative Plan: Extubation in OR  Informed Consent: I have reviewed the patients History and Physical, chart, labs and discussed the procedure including the risks, benefits and alternatives for the proposed anesthesia with the patient or authorized representative who has indicated his/her understanding and acceptance.   Dental advisory given  Plan Discussed with: Surgeon and CRNA  Anesthesia Plan Comments:         Anesthesia Quick Evaluation

## 2015-07-30 NOTE — Brief Op Note (Signed)
07/30/2015  2:15 PM  PATIENT:  Joel Owens  64 y.o. male  PRE-OPERATIVE DIAGNOSIS:  Cervical herniated disc with spondylosis, stenosis, radiculopathy C 45, C 56, C 67 levels  POST-OPERATIVE DIAGNOSIS:  Cervical herniated disc with spondylosis, stenosis, radiculopathy C 45, C 56, C 67 levels  PROCEDURE:  Procedure(s) with comments: C4-5 C5-6 C6-7 Anterior cervical decompression/diskectomy/fusion (N/A) - C4-5 C5-6 C6-7 Anterior cervical decompression/diskectomy/fusion with PEEK cages, autograft, anterior cervical plate  SURGEON:  Surgeon(s) and Role:    * Maeola Harman, MD - Primary    * Julio Sicks, MD - Assisting  PHYSICIAN ASSISTANT:   ASSISTANTS: Poteat, RN   ANESTHESIA:   general  EBL:  Total I/O In: 1500 [I.V.:1500] Out: 100 [Blood:100]  BLOOD ADMINISTERED:none  DRAINS: (10) Jackson-Pratt drain(s) with closed bulb suction in the prevertebral space   LOCAL MEDICATIONS USED:  LIDOCAINE   SPECIMEN:  No Specimen  DISPOSITION OF SPECIMEN:  N/A  COUNTS:  YES  TOURNIQUET:  * No tourniquets in log *  DICTATION: DICTATION: Patient was brought to operating room and following the smooth and uncomplicated induction of general endotracheal anesthesia his head was placed on a horseshoe head holder he was placed in 5 pounds of Holter traction and his anterior neck was prepped and draped in usual sterile fashion. An incision was made on the left side of midline after infiltrating the skin and subcutaneous tissues with local lidocaine. The platysmal layer was incised and subplatysmal dissection was performed exposing the anterior border sternocleidomastoid muscle. Using blunt dissection the carotid sheath was kept lateral and trachea and esophagus kept medial exposing the anterior cervical spine. A bent spinal needle was placed it was felt to be the C4-5 and C5-6 levels and this was confirmed on intraoperative x-ray. Longus coli muscles were taken down from the anterior cervical  spine using electrocautery and key elevator and self-retaining retractor was placed. The interspace at C6-7 was incised and a thorough discectomy was performed. Distraction pins were placed. Uncinate spurs and central spondylitic ridges were drilled down with a high-speed drill. The spinal cord dura and both C7 nerve roots were widely decompressed. There was a considerable amount of spondylotic material which was carefully removed.  Hemostasis was assured. After trial sizing a 7 mm peek interbody cage was selected and packed with morcellized autograft and DBM. The graft was tamped into position and countersunk appropriately. The retractor was moved and the interspace at C5-6 was incised and a thorough discectomy was performed. Distraction pins were placed. Uncinate spurs and central spondylitic ridges were drilled down with a high-speed drill. The spinal cord dura and both C6 nerve roots were widely decompressed. Hemostasis was assured. After trial sizing an 7 mm peek interbody cage was selected and packed in a similar fashiont. The graft was tamped into position and countersunk appropriately.The interspace at C4-5 was incised and a thorough discectomy was performed. Distraction pins were placed. Uncinate spurs and central spondylitic ridges were drilled down with a high-speed drill. The spinal cord dura and both C5 nerve roots were widely decompressed. Hemostasis was assured. After trial sizing a 7 mm peek interbody cage was selected and packed in a similar fashion. The graft was tamped into position and countersunk appropriately.  Distraction weight was removed. A 54 mm Nuvasive Archon anterior cervical plate was affixed to the cervical spine with 13 mm variable-angle screws 2 at C4, 2 at C5, 2 at C6,  and 2 at C7. All screws were well-positioned and locking mechanisms were engaged.  Soft tissues were inspected and found to be in good repair. The wound was irrigated. A final x-ray was obtained with good  visualization at C4 with the interbody graft well visualized. A 10 JP drain was placed. The platysma layer was closed with 3-0 Vicryl stitches and the skin was reapproximated with 3-0 Vicryl subcuticular stitches. The wound was dressed with Dermabond and a sterile occlusive dressing. Counts were correct at the end of the case. Patient was extubated and taken to recovery in stable and satisfactory condition.    PLAN OF CARE: Admit to inpatient   PATIENT DISPOSITION:  PACU - hemodynamically stable.   Delay start of Pharmacological VTE agent (>24hrs) due to surgical blood loss or risk of bleeding: yes

## 2015-07-30 NOTE — Transfer of Care (Signed)
Immediate Anesthesia Transfer of Care Note  Patient: Joel Owens  Procedure(s) Performed: Procedure(s) with comments: C4-5 C5-6 C6-7 Anterior cervical decompression/diskectomy/fusion (N/A) - C4-5 C5-6 C6-7 Anterior cervical decompression/diskectomy/fusion  Patient Location: PACU  Anesthesia Type:General  Level of Consciousness: sedated and responds to stimulation  Airway & Oxygen Therapy: Patient Spontanous Breathing and Patient connected to nasal cannula oxygen  Post-op Assessment: Report given to RN and Post -op Vital signs reviewed and stable  Post vital signs: Reviewed and stable  Last Vitals:  Filed Vitals:   07/30/15 0810  BP:   Pulse:   Temp: 36.9 C  Resp:     Complications: No apparent anesthesia complications

## 2015-07-30 NOTE — Plan of Care (Signed)
Problem: Consults Goal: Diagnosis - Spinal Surgery Outcome: Completed/Met Date Met:  07/30/15 Cervical Spine Fusion

## 2015-07-31 ENCOUNTER — Encounter (HOSPITAL_COMMUNITY): Payer: Self-pay | Admitting: Neurosurgery

## 2015-07-31 MED ORDER — METHOCARBAMOL 500 MG PO TABS: 500.0000 mg | ORAL_TABLET | Freq: Four times a day (QID) | ORAL | Status: DC | PRN

## 2015-07-31 MED ORDER — OXYCODONE-ACETAMINOPHEN 5-325 MG PO TABS: 1.0000 | ORAL_TABLET | ORAL | Status: DC | PRN

## 2015-07-31 NOTE — Discharge Instructions (Signed)

## 2015-07-31 NOTE — Discharge Summary (Signed)
Physician Discharge Summary  Patient ID: Joel Owens MRN: [id number] DOB/AGE: 62-Jul-1952 62 y.o.  Admit date: 07/30/2015 Discharge date: 07/31/2015  Admission Diagnoses: Cervical herniated disc with spondylosis, stenosis, radiculopathy C 45, C 56, C 67 levels   Discharge Diagnoses: Cervical herniated disc with spondylosis, stenosis, radiculopathy C 45, C 56, C 67 levels s/p C4-5 C5-6 C6-7 Anterior cervical decompression/diskectomy/fusion (N/A) - C4-5 C5-6 C6-7 Anterior cervical decompression/diskectomy/fusion with PEEK cages, autograft, anterior cervical plate   Active Problems:   Herniated cervical intervertebral disc   Discharged Condition: good  Hospital Course: Joel Owens was admitted for surgery with dx cervical stenosis, spondylosis, and radiculopathy. Following uncomplicated ACDF C4-7 levels, he recovered nicely and transferred to [facility] for nursing care. He is progressing well.   Consults: None  Significant Diagnostic Studies: radiology: X-Ray: intra-operative  Treatments: surgery: C4-5 C5-6 C6-7 Anterior cervical decompression/diskectomy/fusion (N/A) - C4-5 C5-6 C6-7 Anterior cervical decompression/diskectomy/fusion with PEEK cages, autograft, anterior cervical plate   Discharge Exam: Blood pressure 142/81, pulse 80, temperature 98.4 F (36.9 C), temperature source Oral, resp. rate 22, height 5\' 11"  (1.803 m), weight 128.368 kg (283 lb), SpO2 95 %. Alert, conversant. Excellent strength BUE and hand intrinsics. Incision with Dermabond and honeycomb drsg; no erythema, swelling, or drainage. JP patent, thin bloody, decreasing o/p. Soft collar in use. DSD to site after d/c of drain. No drainage at site.     Disposition: Discharge to home. Per DrStern, JP pulled, dsd to site;  d/c IV, d/c to home. Pt verbalizes understanding of d/c instructions and agrees to call to schedule 3-4 week f/u appt with DrStern.       Medication List    ASK your doctor about these  medications        amLODipine 10 MG tablet  Commonly known as:  NORVASC  Take 10 mg by mouth daily.     CENTRUM SILVER ADULT 50+ PO  Take 1 tablet by mouth daily.     esomeprazole 40 MG capsule  Commonly known as:  NEXIUM  Take 40 mg by mouth daily at 12 noon.     hydrochlorothiazide 25 MG tablet  Commonly known as:  HYDRODIURIL  Take 25 mg by mouth daily.     losartan 50 MG tablet  Commonly known as:  COZAAR  Take 50 mg by mouth daily.     ranitidine 300 MG tablet  Commonly known as:  ZANTAC  Take 300 mg by mouth at bedtime.     tamsulosin 0.4 MG Caps capsule  Commonly known as:  FLOMAX  Take 0.4 mg by mouth daily.     traMADol 50 MG tablet  Commonly known as:  ULTRAM  Take 50-100 mg by mouth every 4 (four) hours as needed (pain).     triamcinolone cream 0.1 %  Commonly known as:  KENALOG  Apply 1 application topically 2 (two) times daily as needed (skin irritation).     vitamin C 500 MG tablet  Commonly known as:  ASCORBIC ACID  Take 500 mg by mouth daily.     Vitamin D3 5000 UNITS Caps  Take 5,000 Units by mouth daily.         Signed: Georgiann Cockeroteat, Eli Pattillo 07/31/2015, 8:12 AM

## 2015-07-31 NOTE — Progress Notes (Signed)
Pt doing well. Pt given D/C instructions with Rx's, verbal understanding was provided. Pt's incision is clean and dry with no sign of infection. Pt's JP drain was removed prior to D/C. Pt's IV was removed prior to D/C. Pt D/C'd home via walking @ 1100 per MD order. Pt is stable @ D/C and has no other needs at this time. Rema FendtAshley Jaycee Pelzer, RN

## 2015-07-31 NOTE — Evaluation (Signed)
Occupational Therapy Evaluation Patient Details Name: Joel Owens MRN: [id number] DOB: [birth year] Today's Date: 07/31/2015    History of Present Illness s/p ACDF C4-5, C5-6, C6-7, H/O B TKA   Clinical Impression   Pt educated in cervical precautions related to mobility, ADL and IADL.  Pt is moving well and performing self care at a modified independent level.  No further OT needs.  Follow Up Recommendations  No OT follow up    Equipment Recommendations  None recommended by OT    Recommendations for Other Services       Precautions / Restrictions Precautions Precautions: Cervical Precaution Comments: reviewed body mechanics and cervical precautions related to mobility and ADL Required Braces or Orthoses: Cervical Brace Cervical Brace: Soft collar Restrictions Weight Bearing Restrictions: No      Mobility Bed Mobility Overal bed mobility: Modified Independent             General bed mobility comments: instructed in log roll technique  Transfers Overall transfer level: Modified independent                    Balance                                            ADL Overall ADL's : Modified independent                                             Vision     Perception     Praxis      Pertinent Vitals/Pain Pain Assessment: No/denies pain     Hand Dominance Right   Extremity/Trunk Assessment Upper Extremity Assessment Upper Extremity Assessment: Overall WFL for tasks assessed (Pt reports resolved pain he had prior to sx.)   Lower Extremity Assessment Lower Extremity Assessment: Overall WFL for tasks assessed       Communication Communication Communication: No difficulties   Cognition Arousal/Alertness: Awake/alert Behavior During Therapy: WFL for tasks assessed/performed Overall Cognitive Status: Within Functional Limits for tasks assessed                     General Comments       Exercises       Shoulder Instructions      Home Living Family/patient expects to be discharged to:: Private residence Living Arrangements: Spouse/significant other Available Help at Discharge: Family;Available 24 hours/day Type of Home: House Home Access: Stairs to enter [employer] of Steps: 6 Entrance Stairs-Rails: Right;Left Home Layout: One level     Bathroom Shower/Tub: Producer, television/film/video: Standard     Home Equipment: Cane - single point;Walker - 2 wheels          Prior Functioning/Environment Level of Independence: Independent             OT Diagnosis:     OT Problem List:     OT Treatment/Interventions:      OT Goals(Current goals can be found in the care plan section)    OT Frequency:     Barriers to D/C:            Co-evaluation              End of Session Equipment Utilized During Treatment: Cervical collar  Activity Tolerance: Patient tolerated treatment well Patient left: in bed;with call bell/phone within reach   Time: 0908-0928 OT Time Calculation (min): 20 min Charges:    G-Codes:    Evern BioMayberry, [place] Zaragoza Lynn 07/31/2015, 9:37 AM

## 2015-07-31 NOTE — Progress Notes (Signed)
PT Cancellation and Discharge Note  Patient Details Name: Joel Owens MRN: [id number] DOB: [birth year]   Cancelled Treatment:    Reason Eval/Treat Not Completed: PT screened, no needs identified, will sign off.  Pt observed up moving well without deficit.  Pt education on safety after cervical surgery and pt verbalized understanding.  No further PT needs at this time, will sign off.     Vallie Fayette, Alison MurrayMegan F 07/31/2015, 9:10 AM

## 2015-07-31 NOTE — Progress Notes (Signed)
Subjective: Patient reports "I feel good...no tingling now"  Objective: Vital signs in last 24 hours: Temp:  [97.5 F (36.4 C)-99.7 F (37.6 C)] 98.4 F (36.9 C) (10/12 0400) Pulse Rate:  [73-106] 80 (10/12 0400) Resp:  [18-29] 22 (10/12 0400) BP: (140-171)/(74-85) 142/81 mmHg (10/12 0400) SpO2:  [91 %-98 %] 95 % (10/12 0400) Weight:  [128.368 kg (283 lb)] 128.368 kg (283 lb) (10/11 0809)  Intake/Output from previous day: 10/11 0701 - 10/12 0700 In: 2340 [P.O.:840; I.V.:1500] Out: 180 [Drains:80; Blood:100] Intake/Output this shift:    Alert, conversant. Excellent strength BUE and hand intrinsics. Incision with Dermabond and honeycomb drsg; no erythema, swelling, or drainage. JP patent, thin bloody, decreasing o/p. Soft collar in use.    Lab Results: No results for input(s): WBC, HGB, HCT, PLT in the last 72 hours. BMET No results for input(s): NA, K, CL, CO2, GLUCOSE, BUN, CREATININE, CALCIUM in the last 72 hours.  Studies/Results: Dg Cervical Spine 2-3 Views  07/30/2015  CLINICAL DATA:  Neck pain EXAM: CERVICAL SPINE  4+ VIEWS COMPARISON:  None. FINDINGS: Three intraoperative portable lateral radiographs are submitted. Patient is intubated. Needle from anterior approach, tip not well seen on the initial image. Tip projects anterior to the C4-5 interspace on the second radiograph. Anterior fixation hardware is noted extending inferiorly from C4 on the final radiograph. The lower cervical spine is not well seen on any image. IMPRESSION: 1. Intraoperative localization for anterior cervical fusion extending inferiorly from C4. Electronically Signed   By: Corlis Leak  Hassell M.D.   On: 07/30/2015 14:03    Assessment/Plan: Improving    LOS: 1 day  Per DrStern, JP pulled, dsd to site;  d/c IV, d/c to home. Pt verbalizes understanding of d/c instructions and agrees to call to schedule 3-4 week f/u appt with DrStern.    Joel Owens, Joel Owens 07/31/2015, 8:08 AM

## 2016-02-25 DIAGNOSIS — E119 Type 2 diabetes mellitus without complications: Secondary | ICD-10-CM

## 2016-12-29 ENCOUNTER — Inpatient Hospital Stay
Admission: EM | Admit: 2016-12-29 | Discharge: 2017-01-04 | DRG: 885 | Disposition: A | Discharge status: Home / Self Care | Payer: BLUE CROSS/BLUE SHIELD | Source: Intra-hospital | Attending: Psychiatry | Admitting: Psychiatry

## 2016-12-29 ENCOUNTER — Encounter: Payer: Self-pay | Admitting: Emergency Medicine

## 2016-12-29 ENCOUNTER — Emergency Department
Admission: EM | Admit: 2016-12-29 | Discharge: 2016-12-29 | Disposition: A | Discharge status: Other Acute Inpatient Hospital | Payer: BLUE CROSS/BLUE SHIELD | Attending: Emergency Medicine | Admitting: Emergency Medicine

## 2016-12-29 DIAGNOSIS — G4730 Sleep apnea, unspecified: Secondary | ICD-10-CM | POA: Diagnosis present

## 2016-12-29 DIAGNOSIS — N400 Enlarged prostate without lower urinary tract symptoms: Secondary | ICD-10-CM | POA: Diagnosis present

## 2016-12-29 DIAGNOSIS — Z563 Stressful work schedule: Secondary | ICD-10-CM

## 2016-12-29 DIAGNOSIS — Z79899 Other long term (current) drug therapy: Secondary | ICD-10-CM | POA: Insufficient documentation

## 2016-12-29 DIAGNOSIS — M549 Dorsalgia, unspecified: Secondary | ICD-10-CM | POA: Diagnosis present

## 2016-12-29 DIAGNOSIS — R45851 Suicidal ideations: Secondary | ICD-10-CM

## 2016-12-29 DIAGNOSIS — Z981 Arthrodesis status: Secondary | ICD-10-CM | POA: Diagnosis not present

## 2016-12-29 DIAGNOSIS — I10 Essential (primary) hypertension: Secondary | ICD-10-CM | POA: Diagnosis present

## 2016-12-29 DIAGNOSIS — Z96653 Presence of artificial knee joint, bilateral: Secondary | ICD-10-CM | POA: Diagnosis present

## 2016-12-29 DIAGNOSIS — F329 Major depressive disorder, single episode, unspecified: Secondary | ICD-10-CM | POA: Insufficient documentation

## 2016-12-29 DIAGNOSIS — F332 Major depressive disorder, recurrent severe without psychotic features: Secondary | ICD-10-CM

## 2016-12-29 DIAGNOSIS — Z630 Problems in relationship with spouse or partner: Secondary | ICD-10-CM

## 2016-12-29 DIAGNOSIS — Z87891 Personal history of nicotine dependence: Secondary | ICD-10-CM | POA: Insufficient documentation

## 2016-12-29 DIAGNOSIS — G4700 Insomnia, unspecified: Secondary | ICD-10-CM | POA: Diagnosis present

## 2016-12-29 DIAGNOSIS — E119 Type 2 diabetes mellitus without complications: Secondary | ICD-10-CM

## 2016-12-29 DIAGNOSIS — M1712 Unilateral primary osteoarthritis, left knee: Secondary | ICD-10-CM | POA: Diagnosis present

## 2016-12-29 DIAGNOSIS — K219 Gastro-esophageal reflux disease without esophagitis: Secondary | ICD-10-CM

## 2016-12-29 DIAGNOSIS — M5020 Other cervical disc displacement, unspecified cervical region: Secondary | ICD-10-CM | POA: Diagnosis present

## 2016-12-29 DIAGNOSIS — F322 Major depressive disorder, single episode, severe without psychotic features: Secondary | ICD-10-CM | POA: Diagnosis present

## 2016-12-29 LAB — COMPREHENSIVE METABOLIC PANEL
ALK PHOS: 69 U/L (ref 38–126)
ALT: 26 U/L (ref 17–63)
ANION GAP: 6 (ref 5–15)
AST: 23 U/L (ref 15–41)
Albumin: 4.2 g/dL (ref 3.5–5.0)
BILIRUBIN TOTAL: 0.4 mg/dL (ref 0.3–1.2)
BUN: 20 mg/dL (ref 6–20)
CALCIUM: 9.5 mg/dL (ref 8.9–10.3)
CO2: 30 mmol/L (ref 22–32)
Chloride: 104 mmol/L (ref 101–111)
Creatinine, Ser: 1.07 mg/dL (ref 0.61–1.24)
GLUCOSE: 134 mg/dL — AB (ref 65–99)
POTASSIUM: 4.1 mmol/L (ref 3.5–5.1)
Sodium: 140 mmol/L (ref 135–145)
TOTAL PROTEIN: 7.9 g/dL (ref 6.5–8.1)

## 2016-12-29 LAB — URINE DRUG SCREEN, QUALITATIVE (ARMC ONLY)
Amphetamines, Ur Screen: NOT DETECTED
BARBITURATES, UR SCREEN: NOT DETECTED
BENZODIAZEPINE, UR SCRN: NOT DETECTED
Cannabinoid 50 Ng, Ur ~~LOC~~: NOT DETECTED
Cocaine Metabolite,Ur ~~LOC~~: NOT DETECTED
MDMA (Ecstasy)Ur Screen: NOT DETECTED
METHADONE SCREEN, URINE: NOT DETECTED
OPIATE, UR SCREEN: NOT DETECTED
Phencyclidine (PCP) Ur S: NOT DETECTED
Tricyclic, Ur Screen: NOT DETECTED

## 2016-12-29 LAB — CBC
HEMATOCRIT: 45 % (ref 40.0–52.0)
Hemoglobin: 15.2 g/dL (ref 13.0–18.0)
MCH: 29.1 pg (ref 26.0–34.0)
MCHC: 33.8 g/dL (ref 32.0–36.0)
MCV: 86.1 fL (ref 80.0–100.0)
PLATELETS: 324 10*3/uL (ref 150–440)
RBC: 5.22 MIL/uL (ref 4.40–5.90)
RDW: 14.3 % (ref 11.5–14.5)
WBC: 8.6 10*3/uL (ref 3.8–10.6)

## 2016-12-29 LAB — ACETAMINOPHEN LEVEL

## 2016-12-29 LAB — SALICYLATE LEVEL

## 2016-12-29 LAB — ETHANOL

## 2016-12-29 MED ORDER — LOSARTAN POTASSIUM 50 MG PO TABS
100.0000 mg | ORAL_TABLET | Freq: Every day | ORAL | Status: DC
  Administered 2016-12-30 – 2017-01-04 (×6): 100 mg via ORAL
  Filled 2016-12-29 (×6): qty 2

## 2016-12-29 MED ORDER — ACETAMINOPHEN 325 MG PO TABS: 650.0000 mg | ORAL_TABLET | Freq: Four times a day (QID) | ORAL | Status: DC | PRN

## 2016-12-29 MED ORDER — PANTOPRAZOLE SODIUM 40 MG PO TBEC
40.0000 mg | DELAYED_RELEASE_TABLET | Freq: Every day | ORAL | Status: DC
  Administered 2016-12-30 – 2017-01-04 (×6): 40 mg via ORAL
  Filled 2016-12-29 (×6): qty 1

## 2016-12-29 MED ORDER — FLUOXETINE HCL 20 MG PO CAPS
20.0000 mg | ORAL_CAPSULE | Freq: Every day | ORAL | Status: DC
  Administered 2016-12-30 – 2017-01-04 (×6): 20 mg via ORAL
  Filled 2016-12-29 (×6): qty 1

## 2016-12-29 MED ORDER — LOSARTAN POTASSIUM 50 MG PO TABS
100.0000 mg | ORAL_TABLET | Freq: Every day | ORAL | Status: DC
  Filled 2016-12-29: qty 2

## 2016-12-29 MED ORDER — HYDROCHLOROTHIAZIDE 25 MG PO TABS
25.0000 mg | ORAL_TABLET | Freq: Every day | ORAL | Status: DC
  Administered 2016-12-30 – 2017-01-04 (×6): 25 mg via ORAL
  Filled 2016-12-29 (×6): qty 1

## 2016-12-29 MED ORDER — PANTOPRAZOLE SODIUM 40 MG PO TBEC
40.0000 mg | DELAYED_RELEASE_TABLET | Freq: Every day | ORAL | Status: DC
  Administered 2016-12-29: 40 mg via ORAL
  Filled 2016-12-29: qty 1

## 2016-12-29 MED ORDER — FLUOXETINE HCL 20 MG PO CAPS
20.0000 mg | ORAL_CAPSULE | Freq: Every day | ORAL | Status: DC
Start: 2016-12-29 — End: 2016-12-29

## 2016-12-29 MED ORDER — FAMOTIDINE 20 MG PO TABS
20.0000 mg | ORAL_TABLET | Freq: Every day | ORAL | Status: DC
  Administered 2016-12-30 – 2017-01-03 (×5): 20 mg via ORAL
  Filled 2016-12-29 (×5): qty 1

## 2016-12-29 MED ORDER — MAGNESIUM HYDROXIDE 400 MG/5ML PO SUSP: 30.0000 mL | Freq: Every day | ORAL | Status: DC | PRN

## 2016-12-29 MED ORDER — HYDROCHLOROTHIAZIDE 25 MG PO TABS
25.0000 mg | ORAL_TABLET | Freq: Every day | ORAL | Status: DC
  Filled 2016-12-29: qty 1

## 2016-12-29 MED ORDER — ALUM & MAG HYDROXIDE-SIMETH 200-200-20 MG/5ML PO SUSP: 30.0000 mL | ORAL | Status: DC | PRN

## 2016-12-29 MED ORDER — FAMOTIDINE 20 MG PO TABS
20.0000 mg | ORAL_TABLET | Freq: Every day | ORAL | Status: DC
  Administered 2016-12-29: 20 mg via ORAL
  Filled 2016-12-29: qty 1

## 2016-12-29 MED ORDER — FLUOXETINE HCL 20 MG PO CAPS: 20.0000 mg | ORAL_CAPSULE | Freq: Every day | ORAL | Status: DC

## 2016-12-29 MED ORDER — TAMSULOSIN HCL 0.4 MG PO CAPS
0.4000 mg | ORAL_CAPSULE | Freq: Every day | ORAL | Status: DC
  Filled 2016-12-29: qty 1

## 2016-12-29 MED ORDER — TAMSULOSIN HCL 0.4 MG PO CAPS
0.4000 mg | ORAL_CAPSULE | Freq: Every day | ORAL | Status: DC
  Administered 2016-12-31 – 2017-01-04 (×5): 0.4 mg via ORAL
  Filled 2016-12-29 (×6): qty 1

## 2016-12-29 NOTE — ED Notes (Signed)
Pt ambulatory from 19H to B[facility] 3, belongings taken to get locked up. Pt had sprite and urine cup in hand. Taken by Lyla Sonarrie, EDT

## 2016-12-29 NOTE — ED Triage Notes (Signed)
Patient was seen by primary doc today, Dr. Zada Finderslmedo, who referred him to ED.  Patient states "I am having trouble coping"   Patient states he has had SI for " a while" with several different plans.  States he would drive into a tree or take sleeping pills.  Denies HI.

## 2016-12-29 NOTE — ED Notes (Signed)
Patient transferred to room 3 from quad, received report from [facility] RN

## 2016-12-29 NOTE — ED Notes (Signed)
Patient is alert and oriented, Patient talked to nurse regarding His job and how stressed he has been because it is so short staffed, patient states " I can't take anymore, and I became so depressed that I was looking forward to dying" patient states that he has not had a vacation in 6 years and that he and His wife are separated, patient with q 15 minute checks and camera surveillance in progress.

## 2016-12-29 NOTE — ED Notes (Signed)
Pt provided sprite to drink per request.  

## 2016-12-29 NOTE — ED Notes (Signed)
ua obtained and sent to lab, Patient alert and oriented, he states he still feels likes Si/, no hi or avh. Patient did talk with Dr. Toni Amendlapacs, nurse will continue to monitor.

## 2016-12-29 NOTE — BH Assessment (Signed)
Assessment Note  Joel MurdersBradley G Owens is an 66 y.o. male who presents to the ER after his PCP recommended him to come, for further evaluation.  Patient states he has had ongoing thoughts of ending his life and for the last several days, they have increased. He further states, his job is the cause of his mental and emotional state. He's been with the same company for approximately 19 years. Approximately three years ago, their CEO reduced their workforce and it resulted in all his support staff been let go. He works more than 60 hours a week, because he have to do the job of five other people.  The last three months have been extremely stressful for him. He is having the thoughts of running his car into a tree and overdosing on sleeping pills. The thoughts are increasing and they are now occurring "every hour on the hour." His sleep has decreased and his appetite fluctuates. When asked what's keeping him from ending his life, he stated "I don't know." He lives in the same home with his wife but their relationship is distant. "She sleep on one side of the house and I'm on the other. In 2006 I was admitted to the hospital (medical). I was up here for two weeks and she didn't call or visit me. It's been like this for years. That's (marriage) is the least of my problems."  During the interview, he was calm, cooperative and pleasant. He was able to answer the questions appropriately. At times, he became tearful and had to wait before he could continue what he was saying. He denies having a history of mental health concerns or treatment. "This is the first time, I've done anything like this and it's embarrassing but I don't know what to do anymore. I can't keep going like this."  Patient denies HI and A/H. He also denies having a history of violence and aggression  Diagnosis: Depression  Past Medical History:  Past Medical History:  Diagnosis Date  . GERD (gastroesophageal reflux disease)   . Heart murmur     as child   no problems  . Hypertension   . Shortness of breath dyspnea    with exertion   . Sleep apnea    never got cpap     Past Surgical History:  Procedure Laterality Date  . ANTERIOR CERVICAL DECOMP/DISCECTOMY FUSION N/A 07/30/2015   Procedure: C4-5 C5-6 C6-7 Anterior cervical decompression/diskectomy/fusion;  Surgeon: Maeola HarmanJoseph Stern, MD;  Location: [facility];  Service: Neurosurgery;  Laterality: N/A;  C4-5 C5-6 C6-7 Anterior cervical decompression/diskectomy/fusion  . BACK SURGERY     1980s  . KNEE ARTHROPLASTY     bilat   . ROTATOR CUFF REPAIR     left   . TONSILLECTOMY      Family History: No family history on file.  Social History:  reports that he has quit smoking. He quit after 5.00 years of use. He has never used smokeless tobacco. He reports that he does not drink alcohol or use drugs.  Additional Social History:  Alcohol / Drug Use Pain Medications: See PTA Prescriptions: See PTA Over the Counter: See PTA History of alcohol / drug use?: No history of alcohol / drug abuse Negative Consequences of Use:  (n/a) Withdrawal Symptoms:  (n/a)  CIWA: CIWA-Ar BP: (!) 141/72 Pulse Rate: 85 COWS:    Allergies:  Allergies  Allergen Reactions  . Ace Inhibitors Cough    Home Medications:  (Not in a hospital admission)  OB/GYN Status:  No LMP for male patient.  General Assessment Data Location of Assessment: [facility] ED TTS Assessment: In system Is this a Tele or Face-to-Face Assessment?: Face-to-Face Is this an Initial Assessment or a Re-assessment for this encounter?: Initial Assessment Marital status: Married [place] name: n/a Is patient pregnant?: No Pregnancy Status: No Living Arrangements: Spouse/significant other Can pt return to current living arrangement?: Yes Admission Status: Voluntary Is patient capable of signing voluntary admission?: Yes Referral Source: Self/Family/Friend Insurance type: AETNA  Medical Screening Exam [facility] Walk-in  ONLY) Medical Exam completed: Yes  Crisis Care Plan Living Arrangements: Spouse/significant other Legal Guardian: Other: (Self) Name of Psychiatrist: Reports of none Name of Therapist: Reports of none  Education Status Is patient currently in school?: No Current Grade: n/a Highest grade of school patient has completed: [employer] Name of school: n/a Contact person: n/a  Risk to self with the past 6 months Suicidal Ideation: Yes-Currently Present Has patient been a risk to self within the past 6 months prior to admission? : Yes Suicidal Intent: No Has patient had any suicidal intent within the past 6 months prior to admission? : Other (comment) (Patient would not give a specific answer ) Is patient at risk for suicide?: Yes Suicidal Plan?: Yes-Currently Present Has patient had any suicidal plan within the past 6 months prior to admission? : Yes Specify Current Suicidal Plan: Ran into a tree or overdose on medications & sleeping. Access to Means: Yes Specify Access to Suicidal Means: Traffic and Medications What has been your use of drugs/alcohol within the last 12 months?: Reports of none Previous Attempts/Gestures: No How many times?: 0 Other Self Harm Risks: Reports of none Triggers for Past Attempts: None known Intentional Self Injurious Behavior: None Family Suicide History: No Recent stressful life event(s): Conflict (Comment), Other (Comment) (Work Stress) Persecutory voices/beliefs?: No Depression: Yes Depression Symptoms: Insomnia, Tearfulness, Isolating, Fatigue, Loss of interest in usual pleasures, Feeling worthless/self pity, Feeling angry/irritable Substance abuse history and/or treatment for substance abuse?: No Suicide prevention information given to non-admitted patients: Not applicable  Risk to Others within the past 6 months Homicidal Ideation: No Does patient have any lifetime risk of violence toward others beyond the six months prior to admission? :  No Thoughts of Harm to Others: No Current Homicidal Intent: No Current Homicidal Plan: No Access to Homicidal Means: No Identified Victim: Reports of none History of harm to others?: No Assessment of Violence: None Noted Violent Behavior Description: Reports of none Does patient have access to weapons?: No Criminal Charges Pending?: No Does patient have a court date: No Is patient on probation?: No  Psychosis Hallucinations: None noted Delusions: None noted  Mental Status Report Appearance/Hygiene: Unremarkable, In scrubs Eye Contact: Good Motor Activity: Freedom of movement, Unremarkable Speech: Logical/coherent, Unremarkable Level of Consciousness: Alert Mood: Depressed, Anxious, Helpless, Sad, Pleasant Affect: Anxious, Appropriate to circumstance, Depressed, Sad Anxiety Level: Minimal Thought Processes: Coherent, Relevant Judgement: Unimpaired Orientation: Person, Place, Time, Situation, Appropriate for developmental age Obsessive Compulsive Thoughts/Behaviors: Minimal  Cognitive Functioning Concentration: Normal Memory: Recent Intact, Remote Intact IQ: Average Insight: Fair Impulse Control: Fair Appetite: Fair Weight Loss: 0 Weight Gain: 0 Sleep: Decreased (Trouble falling and staying asleep) Total Hours of Sleep: 3 Vegetative Symptoms: None  ADLScreening [facility] Assessment Services) Patient's cognitive ability adequate to safely complete daily activities?: Yes Patient able to express need for assistance with ADLs?: Yes Independently performs ADLs?: Yes (appropriate for developmental age)  Prior Inpatient Therapy Prior Inpatient Therapy: No Prior Therapy Dates: Reports of none  Prior Therapy Facilty/Provider(s): Reports of none Reason for Treatment: Reports of none  Prior Outpatient Therapy Prior Outpatient Therapy: No Prior Therapy Dates: Reports of none Prior Therapy Facilty/Provider(s): Reports of none Reason for Treatment: Reports of none Does patient  have an ACCT team?: No Does patient have Intensive In-House Services?  : No Does patient have [facility] services? : No Does patient have P4CC services?: No  ADL Screening (condition at time of admission) Patient's cognitive ability adequate to safely complete daily activities?: Yes Is the patient deaf or have difficulty hearing?: No Does the patient have difficulty seeing, even when wearing glasses/contacts?: No Does the patient have difficulty concentrating, remembering, or making decisions?: No Patient able to express need for assistance with ADLs?: Yes Does the patient have difficulty dressing or bathing?: No Independently performs ADLs?: Yes (appropriate for developmental age) Does the patient have difficulty walking or climbing stairs?: No Weakness of Legs: None Weakness of Arms/Hands: None  Home Assistive Devices/Equipment Home Assistive Devices/Equipment: None  Therapy Consults (therapy consults require a physician order) PT Evaluation Needed: No OT Evalulation Needed: No SLP Evaluation Needed: No Abuse/Neglect Assessment (Assessment to be complete while patient is alone) Physical Abuse: Denies Verbal Abuse: Denies Sexual Abuse: Denies Exploitation of patient/patient's resources: Denies Self-Neglect: Denies Values / Beliefs Cultural Requests During Hospitalization: None Spiritual Requests During Hospitalization: None Consults Spiritual Care Consult Needed: No Social Work Consult Needed: No Merchant navy officer (For Healthcare) Does Patient Have a Medical Advance Directive?: No    Additional Information 1:1 In Past 12 Months?: No CIRT Risk: No Elopement Risk: No Does patient have medical clearance?: Yes  Child/Adolescent Assessment Running Away Risk: Denies (Patient is an adult)  Disposition:  Disposition Initial Assessment Completed for this Encounter: Yes Disposition of Patient: Other dispositions (ER MD ordered Psych Consult)  On Site Evaluation by:    Reviewed with Physician:    Lilyan Gilford MS, LCAS, LPC, NCC, CCSI Therapeutic Triage Specialist 12/29/2016 12:37 PM

## 2016-12-29 NOTE — ED Notes (Signed)
Pt given breakfast tray

## 2016-12-29 NOTE — Consult Note (Signed)
[facility] Face-to-Face Psychiatry Consult   Reason for Consult:  Consult for 66 year old man who presented from his primary care doctor's office because of suicidal thoughts. Referring Physician:  Cinda Quest Patient Identification: Joel Owens MRN:  [id number] Principal Diagnosis: Severe recurrent major depression without psychotic features [facility]) Diagnosis:   Patient Active Problem List   Diagnosis Date Noted  . Severe recurrent major depression without psychotic features ([place]) [F33.2] 12/29/2016  . Hypertension [I10] 12/29/2016  . 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 male patient admitted with "things have been very stressful".  HPI:  Patient interviewed. Chart reviewed. 66 year old man referred from his primary care doctor's office because of suicidal thinking. Patient reports that his mood has been stressed out and hopeless and has been getting worse for the last couple weeks. His sleep patterns are very irregular and interrupted at night. Appetite has been poor. He feels run down and without energy and has no enjoyment in any activities. Recently has been having active thoughts of killing himself by running his car off parole. Patient reports his biggest stress is his job. He has been doing the same job for about 19 years and finds it incredibly stressful. He says that over time they have piled more work on to him until he does the job of 5 people and has not been able to take a vacation in 6 years. He does like it is demanded that he worked 24 hours a day and he still gets little appreciation for it. Denies any psychotic symptoms. Denies that he is drinking or using any drugs. Patient just today started talking to his primary care doctor about his symptoms.  Social history: Patient is married but he says that he and his wife have a very strained relationship. They don't sleep together  and he feels like she is completely unsupportive. 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 Doesn't have anything that he enjoys.  Medical history: He says he's been diagnosed with high blood pressure and inflamed prostate no other medical problems active.  Substance abuse history: He says he drinks very little maybe 4 drinks total in a year no history of drug abuse.  Past Psychiatric History: No past history of psychiatric treatment. No hospitalization. No suicide attempts. Never been on any psychiatric medicine. No history of psychotic symptoms or substance abuse.  Risk to Self: Suicidal Ideation: Yes-Currently Present Suicidal Intent: No Is patient at risk for suicide?: Yes Suicidal Plan?: Yes-Currently Present Specify Current Suicidal Plan: Ran into a tree or overdose on medications & sleeping. Access to Means: Yes Specify Access to Suicidal Means: Traffic and Medications What has been your use of drugs/alcohol within the last 12 months?: Reports of none How many times?: 0 Other Self Harm Risks: Reports of none Triggers for Past Attempts: None known Intentional Self Injurious Behavior: None Risk to Others: Homicidal Ideation: No Thoughts of Harm to Others: No Current Homicidal Intent: No Current Homicidal Plan: No Access to Homicidal Means: No Identified Victim: Reports of none History of harm to others?: No Assessment of Violence: None Noted Violent Behavior Description: Reports of none Does patient have access to weapons?: No Criminal Charges Pending?: No Does patient have a court date: No Prior Inpatient Therapy: Prior Inpatient Therapy: No Prior Therapy Dates: Reports of none Prior Therapy Facilty/Provider(s): Reports of none Reason for Treatment: Reports of  none Prior Outpatient Therapy: Prior Outpatient Therapy: No Prior Therapy Dates: Reports of none Prior Therapy Facilty/Provider(s): Reports of none Reason for Treatment:  Reports of none Does patient have an ACCT team?: No Does patient have Intensive In-House Services?  : No Does patient have [facility] services? : No Does patient have P4CC services?: No  Past Medical History:  Past Medical History:  Diagnosis Date  . GERD (gastroesophageal reflux disease)   . Heart murmur    as child   no problems  . Hypertension   . Shortness of breath dyspnea    with exertion   . Sleep apnea    never got cpap     Past Surgical History:  Procedure Laterality Date  . ANTERIOR CERVICAL DECOMP/DISCECTOMY FUSION N/A 07/30/2015   Procedure: C4-5 C5-6 C6-7 Anterior cervical decompression/diskectomy/fusion;  Surgeon: Erline Levine, MD;  Location: [place] [facility];  Service: Neurosurgery;  Laterality: N/A;  C4-5 C5-6 C6-7 Anterior cervical decompression/diskectomy/fusion  . BACK SURGERY     1980s  . KNEE ARTHROPLASTY     bilat   . ROTATOR CUFF REPAIR     left   . TONSILLECTOMY     Family History: No family history on file. Family Psychiatric  History: Patient says he does not know of any family history because he is adopted Social History:  History  Alcohol Use No     History  Drug Use No    Social History   Social History  . Marital status: Married    Spouse name: N/A  . Number of children: N/A  . Years of education: N/A   Social History Main Topics  . Smoking status: Former Smoker    Years: 5.00  . Smokeless tobacco: Never Used  . Alcohol use No  . Drug use: No  . Sexual activity: Not Asked   Other Topics Concern  . None   Social History Narrative  . None   Additional Social History:    Allergies:   Allergies  Allergen Reactions  . Ace Inhibitors Cough    Labs:  Results for orders placed or performed during the hospital encounter of 12/29/16 (from the past 48 hour(s))  Comprehensive metabolic panel     Status: Abnormal   Collection Time: 12/29/16  9:12 AM  Result Value Ref Range   Sodium 140 135 - 145 mmol/L   Potassium 4.1 3.5 - 5.1  mmol/L   Chloride 104 101 - 111 mmol/L   CO2 30 22 - 32 mmol/L   Glucose, Bld 134 (H) 65 - 99 mg/dL   BUN 20 6 - 20 mg/dL   Creatinine, Ser 1.07 0.61 - 1.24 mg/dL   Calcium 9.5 8.9 - 10.3 mg/dL   Total Protein 7.9 6.5 - 8.1 g/dL   Albumin 4.2 3.5 - 5.0 g/dL   AST 23 15 - 41 U/L   ALT 26 17 - 63 U/L   Alkaline Phosphatase 69 38 - 126 U/L   Total Bilirubin 0.4 0.3 - 1.2 mg/dL   GFR calc non Af Amer >60 >60 mL/min   GFR calc Af Amer >60 >60 mL/min    Comment: (NOTE) The eGFR has been calculated using the CKD EPI equation. This calculation has not been validated in all clinical situations. eGFR's persistently <60 mL/min signify possible Chronic Kidney Disease.    Anion gap 6 5 - 15  Ethanol     Status: None   Collection Time: 12/29/16  9:12 AM  Result Value Ref Range   Alcohol,  Ethyl (B) <5 <5 mg/dL    Comment:        LOWEST DETECTABLE LIMIT FOR SERUM ALCOHOL IS 5 mg/dL FOR MEDICAL PURPOSES ONLY   Salicylate level     Status: None   Collection Time: 12/29/16  9:12 AM  Result Value Ref Range   Salicylate Lvl <0.2 2.8 - 30.0 mg/dL  Acetaminophen level     Status: Abnormal   Collection Time: 12/29/16  9:12 AM  Result Value Ref Range   Acetaminophen (Tylenol), Serum <10 (L) 10 - 30 ug/mL    Comment:        THERAPEUTIC CONCENTRATIONS VARY SIGNIFICANTLY. A RANGE OF 10-30 ug/mL MAY BE AN EFFECTIVE CONCENTRATION FOR MANY PATIENTS. HOWEVER, SOME ARE BEST TREATED AT CONCENTRATIONS OUTSIDE THIS RANGE. ACETAMINOPHEN CONCENTRATIONS >150 ug/mL AT 4 HOURS AFTER INGESTION AND >50 ug/mL AT 12 HOURS AFTER INGESTION ARE OFTEN ASSOCIATED WITH TOXIC REACTIONS.   cbc     Status: None   Collection Time: 12/29/16  9:12 AM  Result Value Ref Range   WBC 8.6 3.8 - 10.6 K/uL   RBC 5.22 4.40 - 5.90 MIL/uL   Hemoglobin 15.2 13.0 - 18.0 g/dL   HCT 45.0 40.0 - 52.0 %   MCV 86.1 80.0 - 100.0 fL   MCH 29.1 26.0 - 34.0 pg   MCHC 33.8 32.0 - 36.0 g/dL   RDW 14.3 11.5 - 14.5 %   Platelets 324  150 - 440 K/uL    Current Facility-Administered Medications  Medication Dose Route Frequency Provider Last Rate Last Dose  . losartan (COZAAR) tablet 100 mg  100 mg Oral Daily Darel Hong, MD      . tamsulosin (FLOMAX) capsule 0.4 mg  0.4 mg Oral Daily Darel Hong, MD       Current Outpatient Prescriptions  Medication Sig Dispense Refill  . hydrochlorothiazide (HYDRODIURIL) 25 MG tablet Take 25 mg by mouth daily.    Marland Kitchen losartan (COZAAR) 100 MG tablet Take 100 mg by mouth daily.     . tamsulosin (FLOMAX) 0.4 MG CAPS capsule Take 0.4 mg by mouth daily.    Marland Kitchen amLODipine (NORVASC) 10 MG tablet Take 10 mg by mouth daily.    . Cholecalciferol (VITAMIN D3) 5000 UNITS CAPS Take 5,000 Units by mouth daily.    Marland Kitchen esomeprazole (NEXIUM) 40 MG capsule Take 40 mg by mouth daily at 12 noon.    . methocarbamol (ROBAXIN) 500 MG tablet Take 1 tablet (500 mg total) by mouth every 6 (six) hours as needed for muscle spasms. (Patient not taking: Reported on 12/29/2016) 60 tablet 1  . Multiple Vitamins-Minerals (CENTRUM SILVER ADULT 50+ PO) Take 1 tablet by mouth daily.    Marland Kitchen oxyCODONE-acetaminophen (PERCOCET/ROXICET) 5-325 MG tablet Take 1-2 tablets by mouth every 4 (four) hours as needed for moderate pain. (Patient not taking: Reported on 12/29/2016) 60 tablet 0  . ranitidine (ZANTAC) 300 MG tablet Take 300 mg by mouth at bedtime.    . traMADol (ULTRAM) 50 MG tablet Take 50-100 mg by mouth every 4 (four) hours as needed (pain).    . triamcinolone cream (KENALOG) 0.1 % Apply 1 application topically 2 (two) times daily as needed (skin irritation).    . vitamin C (ASCORBIC ACID) 500 MG tablet Take 500 mg by mouth daily.      Musculoskeletal: Strength & Muscle Tone: within normal limits Gait & Station: normal Patient leans: N/A  Psychiatric Specialty Exam: Physical Exam  Nursing note and vitals reviewed. Constitutional: He appears well-developed and  well-nourished.  HENT:  Head: Normocephalic and  atraumatic.  Eyes: Conjunctivae are normal. Pupils are equal, round, and reactive to light.  Neck: Normal range of motion.  Cardiovascular: Regular rhythm and normal heart sounds.   Respiratory: Effort normal. No respiratory distress.  GI: Soft.  Musculoskeletal: Normal range of motion.  Neurological: He is alert.  Skin: Skin is warm and dry.  Psychiatric: His mood appears anxious. His speech is delayed. He is slowed. Cognition and memory are normal. He expresses impulsivity. He exhibits a depressed mood. He expresses suicidal ideation. He expresses suicidal plans.    Review of Systems  Constitutional: Positive for malaise/fatigue.  HENT: Negative.   Eyes: Negative.   Respiratory: Negative.   Cardiovascular: Negative.   Gastrointestinal: Negative.   Musculoskeletal: Negative.   Skin: Negative.   Neurological: Negative.   Psychiatric/Behavioral: Positive for depression and suicidal ideas. Negative for hallucinations, memory loss and substance abuse. The patient is nervous/anxious and has insomnia.     Blood pressure (!) 149/73, pulse (!) 59, temperature 98.2 F (36.8 C), temperature source Oral, resp. rate 18, height _0  (1.803 m), weight 127 kg (280 lb), SpO2 98 %.Body mass index is 39.05 kg/m.  General Appearance: Fairly Groomed  Eye Contact:  Minimal  Speech:  Slow  Volume:  Decreased  Mood:  Anxious and Depressed  Affect:  Congruent and Depressed  Thought Process:  Coherent  Orientation:  Full (Time, Place, and Person)  Thought Content:  Logical  Suicidal Thoughts:  Yes.  with intent/plan  Homicidal Thoughts:  No  Memory:  Immediate;   Good Recent;   Fair Remote;   Fair  Judgement:  Fair  Insight:  Fair  Psychomotor Activity:  Decreased  Concentration:  Concentration: Fair  Recall:  [employer] of Knowledge:  Fair  Language:  Fair  Akathisia:  No  Handed:  Right  AIMS (if indicated):     Assets:  Communication Skills Desire for Improvement Financial  Resources/Insurance Housing Physical Health Resilience Social Support  ADL's:  Intact  Cognition:  WNL  Sleep:        Treatment Plan Summary: Daily contact with patient to assess and evaluate symptoms and progress in treatment, Medication management and Plan 66 year old man who presents with major depression that is rapidly worsening. Feels extremely stressed. Very little support in his life. Patient has significant risk factors for suicide. He agrees to admission to the psychiatric ward. Medication for depression can be started. Full evaluation and treatment plan downstairs.  Disposition: Recommend psychiatric Inpatient admission when medically cleared. Supportive therapy provided about ongoing stressors.  Alethia Berthold, MD 12/29/2016 4:37 PM

## 2016-12-29 NOTE — ED Provider Notes (Signed)
[facility] Emergency Department Provider Note  ____________________________________________   First MD Initiated Contact with Patient 12/29/16 0932     (approximate)  I have reviewed the triage vital signs and the nursing notes.   HISTORY  Chief Complaint Suicidal    HPI Joel Owens is a 66 y.o. male who self presents to the emergency department with suicidal ideation. He presented to his primary care physician this morning and reported a sense of hopelessness and despair and his physician advised him to come to the emergency department for emergent psychiatric evaluation. The patient declines actually making an attempt on his life. He has never tried to hurt himself before. He said that he feels upset and depressed and that he might just want to drive into a tree. He denies chest pain, shortness of breath, abdominal pain, nausea, vomiting.   Past Medical History:  Diagnosis Date  . GERD (gastroesophageal reflux disease)   . Heart murmur    as child   no problems  . Hypertension   . Shortness of breath dyspnea    with exertion   . Sleep apnea    never got cpap     Patient Active Problem List   Diagnosis Date Noted  . Herniated cervical intervertebral disc 07/30/2015    Past Surgical History:  Procedure Laterality Date  . ANTERIOR CERVICAL DECOMP/DISCECTOMY FUSION N/A 07/30/2015   Procedure: C4-5 C5-6 C6-7 Anterior cervical decompression/diskectomy/fusion;  Surgeon: Maeola HarmanJoseph Stern, MD;  Location: [facility];  Service: Neurosurgery;  Laterality: N/A;  C4-5 C5-6 C6-7 Anterior cervical decompression/diskectomy/fusion  . BACK SURGERY     1980s  . KNEE ARTHROPLASTY     bilat   . ROTATOR CUFF REPAIR     left   . TONSILLECTOMY      Prior to Admission medications   Medication Sig Start Date End Date Taking? Authorizing Provider  hydrochlorothiazide (HYDRODIURIL) 25 MG tablet Take 25 mg by mouth daily.   Yes Historical Provider, MD    losartan (COZAAR) 100 MG tablet Take 100 mg by mouth daily.    Yes Historical Provider, MD  tamsulosin (FLOMAX) 0.4 MG CAPS capsule Take 0.4 mg by mouth daily.   Yes Historical Provider, MD  amLODipine (NORVASC) 10 MG tablet Take 10 mg by mouth daily.    Historical Provider, MD  Cholecalciferol (VITAMIN D3) 5000 UNITS CAPS Take 5,000 Units by mouth daily.    Historical Provider, MD  esomeprazole (NEXIUM) 40 MG capsule Take 40 mg by mouth daily at 12 noon.    Historical Provider, MD  methocarbamol (ROBAXIN) 500 MG tablet Take 1 tablet (500 mg total) by mouth every 6 (six) hours as needed for muscle spasms. Patient not taking: Reported on 12/29/2016 07/31/15   Maeola HarmanJoseph Stern, MD  Multiple Vitamins-Minerals (CENTRUM SILVER ADULT 50+ PO) Take 1 tablet by mouth daily.    Historical Provider, MD  oxyCODONE-acetaminophen (PERCOCET/ROXICET) 5-325 MG tablet Take 1-2 tablets by mouth every 4 (four) hours as needed for moderate pain. Patient not taking: Reported on 12/29/2016 07/31/15   Maeola HarmanJoseph Stern, MD  ranitidine (ZANTAC) 300 MG tablet Take 300 mg by mouth at bedtime.    Historical Provider, MD  traMADol (ULTRAM) 50 MG tablet Take 50-100 mg by mouth every 4 (four) hours as needed (pain).    Historical Provider, MD  triamcinolone cream (KENALOG) 0.1 % Apply 1 application topically 2 (two) times daily as needed (skin irritation).    Historical Provider, MD  vitamin C (ASCORBIC ACID) 500  MG tablet Take 500 mg by mouth daily.    Historical Provider, MD    Allergies Ace inhibitors  No family history on file.  Social History Social History  Substance Use Topics  . Smoking status: Former Smoker    Years: 5.00  . Smokeless tobacco: Never Used  . Alcohol use No    Review of Systems Constitutional: No fever/chills Eyes: No visual changes. ENT: No sore throat. Cardiovascular: Denies chest pain. Respiratory: Denies shortness of breath. Gastrointestinal: No abdominal pain.  No nausea, no vomiting.  No  diarrhea.  No constipation. Genitourinary: Negative for dysuria. Musculoskeletal: Negative for back pain. Skin: Negative for rash. Neurological: Negative for headaches, focal weakness or numbness. Psychiatric:Depressed  10-point ROS otherwise negative.  ____________________________________________   PHYSICAL EXAM:  VITAL SIGNS: ED Triage Vitals  Enc Vitals Group     BP 12/29/16 0911 (!) 156/90     Pulse Rate 12/29/16 0911 74     Resp 12/29/16 0911 16     Temp 12/29/16 0911 97 F (36.1 C)     Temp Source 12/29/16 0911 Oral     SpO2 12/29/16 0911 99 %     Weight 12/29/16 0912 280 lb (127 kg)     Height 12/29/16 0912 5\' 11"  (1.803 m)     Head Circumference --      Peak Flow --      Pain Score 12/29/16 0912 0     Pain Loc --      Pain Edu? --      Excl. in GC? --     Constitutional: Alert and oriented x 4 well appearing nontoxic no diaphoresis speaks in full, clear sentences Eyes: PERRL EOMI. Head: Atraumatic. Nose: No congestion/rhinnorhea. Mouth/Throat: No trismus Neck: No stridor.   Cardiovascular: Normal rate, regular rhythm. Grossly normal heart sounds.  Good peripheral circulation. Respiratory: Normal respiratory effort.  No retractions. Lungs CTAB and moving good air Gastrointestinal: Soft nondistended nontender no rebound no guarding no peritonitis no McBurney's tenderness negative Rovsing's no costovertebral tenderness negative Murphy's Musculoskeletal: No lower extremity edema   Neurologic:  Normal speech and language. No gross focal neurologic deficits are appreciated. Skin:  Skin is warm, dry and intact. No rash noted. Psychiatric: Sad, flat affect.    ____________________________________________   DIFFERENTIAL  Drug overdose, suicidal ideation, generalized anxiety disorder, major depressive episode ____________________________________________   LABS (all labs ordered are listed, but only abnormal results are displayed)  Labs Reviewed    COMPREHENSIVE METABOLIC PANEL - Abnormal; Notable for the following:       Result Value   Glucose, Bld 134 (*)    All other components within normal limits  ACETAMINOPHEN LEVEL - Abnormal; Notable for the following:    Acetaminophen (Tylenol), Serum <10 (*)    All other components within normal limits  ETHANOL  SALICYLATE LEVEL  CBC  URINE DRUG SCREEN, QUALITATIVE ([facility] ONLY)    unremarkable __________________________________________  EKG  ED ECG REPORT I, Merrily Brittle, the attending physician, personally viewed and interpreted this ECG.  Date: 12/29/2016 Rate: 72 Rhythm: normal sinus rhythm QRS Axis: Leftward Intervals: normal ST/T Wave abnormalities: normal Conduction Disturbances: none Narrative Interpretation: Abnormal  ____________________________________________  RADIOLOGY   ____________________________________________   PROCEDURES  Procedure(s) performed: no  Procedures  Critical Care performed: no  ____________________________________________   INITIAL IMPRESSION / ASSESSMENT AND PLAN / ED COURSE  Pertinent labs & imaging results that were available during my care of the patient were reviewed by me and considered in my  medical decision making (see chart for details).  The patient presents voluntarily for psychiatric evaluation given his depression. His medical workup is negative for acute pathology. He is medically stable for psychiatric evaluation.      ____________________________________________   FINAL CLINICAL IMPRESSION(S) / ED DIAGNOSES  Final diagnoses:  Suicidal ideation      NEW MEDICATIONS STARTED DURING THIS VISIT:  New Prescriptions   No medications on file     Note:  This document was prepared using Dragon voice recognition software and may include unintentional dictation errors.     Merrily Brittle, MD 12/29/16 [id number]

## 2016-12-29 NOTE — ED Notes (Signed)
Sprite given to pt. EKG completed and given to MD

## 2016-12-29 NOTE — ED Notes (Signed)
Preparing patient for transfer to BMU.  Report called to Casimiro NeedleMichael, RN.  Patient currently denies SI/HI/AVH.

## 2016-12-30 ENCOUNTER — Encounter: Payer: Self-pay | Admitting: Psychiatry

## 2016-12-30 DIAGNOSIS — K219 Gastro-esophageal reflux disease without esophagitis: Secondary | ICD-10-CM

## 2016-12-30 DIAGNOSIS — N400 Enlarged prostate without lower urinary tract symptoms: Secondary | ICD-10-CM | POA: Diagnosis present

## 2016-12-30 DIAGNOSIS — F322 Major depressive disorder, single episode, severe without psychotic features: Principal | ICD-10-CM

## 2016-12-30 LAB — TSH: TSH: 2.493 u[IU]/mL (ref 0.350–4.500)

## 2016-12-30 LAB — LIPID PANEL
CHOLESTEROL: 160 mg/dL (ref 0–200)
HDL: 32 mg/dL — AB (ref >40)
LDL Cholesterol: 84 mg/dL (ref 0–99)
TRIGLYCERIDES: 219 mg/dL — AB (ref <150)
Total CHOL/HDL Ratio: 5 RATIO
VLDL: 44 mg/dL — ABNORMAL HIGH (ref 0–40)

## 2016-12-30 MED ORDER — AMLODIPINE BESYLATE 5 MG PO TABS
10.0000 mg | ORAL_TABLET | Freq: Every day | ORAL | Status: DC
  Administered 2016-12-31 – 2017-01-04 (×5): 10 mg via ORAL
  Filled 2016-12-30 (×5): qty 2

## 2016-12-30 MED ORDER — TRAZODONE HCL 50 MG PO TABS: 25.0000 mg | ORAL_TABLET | Freq: Every evening | ORAL | Status: DC | PRN

## 2016-12-30 MED ORDER — TRAZODONE HCL 50 MG PO TABS
25.0000 mg | ORAL_TABLET | Freq: Every day | ORAL | Status: DC
  Administered 2016-12-30: 25 mg via ORAL
  Filled 2016-12-30: qty 1

## 2016-12-30 NOTE — H&P (Signed)
Psychiatric Admission Assessment Adult  Patient Identification: Joel Owens MRN:  [id number] Date of Evaluation:  12/30/2016 Chief Complaint:  Depression Principal Diagnosis: Severe major depression, single episode, without psychotic features (HCC) Diagnosis:   Patient Active Problem List   Diagnosis Date Noted  . GERD (gastroesophageal reflux disease) [K21.9] 12/30/2016  . BPH (benign prostatic hyperplasia) [N40.0] 12/30/2016  . Hypertension [I10] 12/29/2016  . Severe major depression, single episode, without psychotic features (HCC) [F32.2] 12/29/2016  . Type 2 diabetes mellitus without complication, without long-term current use of insulin (HCC) [E11.9] 02/25/2016  . Herniated cervical intervertebral disc [M50.20] 07/30/2015  . Primary osteoarthritis of left knee [M17.12] 06/13/2014  . Back pain [M54.9] 07/17/2013   History of Present Illness:    66 year old man Who came to our emergency department voluntarily on March 13. He was referred to the ER by his primary care doctor Dr. Zada Finders due to suicidal ideation.  "I am having trouble coping"   Patient stated he has had SI for " a while" with several different plans--he would drive into a tree or take sleeping pills.    His job is the cause of his mental and emotional state. He's been working for the same [employer] for approximately 19 years. Approximately three years ago, their CEO reduced their workforce and it resulted in all his support staff been let go. He works more than 60 hours a week, because he have to do the job of five other people. He also has not taking any vacation in 6 years  The last three months have been extremely stressful for him. He is having the thoughts of running his car into a tree and overdosing on sleeping pills. The thoughts are increasing and they are now occurring "every hour on the hour." His sleep has decreased and his appetite fluctuates. When asked what's keeping him from ending his life,  he stated "I don't know." He lives in the same home with his wife but their relationship is distant.    Substance abuse history: He says he drinks very little maybe 4 drinks total in a year no history of drug abuse. He is a former smoker  Associated Signs/Symptoms: Depression Symptoms:  depressed mood, insomnia, hopelessness, suicidal thoughts with specific plan, (Hypo) Manic Symptoms:  Irritable Mood, Anxiety Symptoms:  Excessive Worry, Psychotic Symptoms:  denies PTSD Symptoms: Negative Total Time spent with patient: 1 hour  Past Psychiatric History: No past history of psychiatric treatment. No hospitalization. No suicide attempts. Never been on any psychiatric medicine. No history of psychotic symptoms or substance abuse.  Is the patient at risk to self? Yes.    Has the patient been a risk to self in the past 6 months? No.  Has the patient been a risk to self within the distant past? No.  Is the patient a risk to others? No.  Has the patient been a risk to others in the past 6 months? No.  Has the patient been a risk to others within the distant past? No.   Alcohol Screening: 1. How often do you have a drink containing alcohol?: Never 9. Have you or someone else been injured as a result of your drinking?: No 10. Has a relative or friend or a doctor or another health worker been concerned about your drinking or suggested you cut down?: No Alcohol Use Disorder Identification Test Final Score (AUDIT): 0 Brief Intervention: AUDIT score less than 7 or less-screening does not suggest unhealthy drinking-brief intervention not indicated  Past  Medical History: He says he's been diagnosed with high blood pressure and inflamed prostate no other medical problems active. Past Medical History:  Diagnosis Date  . GERD (gastroesophageal reflux disease)   . Heart murmur    as child   no problems  . Hypertension   . Shortness of breath dyspnea    with exertion   . Sleep apnea    never got  cpap     Past Surgical History:  Procedure Laterality Date  . ANTERIOR CERVICAL DECOMP/DISCECTOMY FUSION N/A 07/30/2015   Procedure: C4-5 C5-6 C6-7 Anterior cervical decompression/diskectomy/fusion;  Surgeon: Maeola Harman, MD;  Location: [facility];  Service: Neurosurgery;  Laterality: N/A;  C4-5 C5-6 C6-7 Anterior cervical decompression/diskectomy/fusion  . BACK SURGERY     1980s  . KNEE ARTHROPLASTY     bilat   . ROTATOR CUFF REPAIR     left   . TONSILLECTOMY     Family History: History reviewed. No pertinent family history.  Family Psychiatric  History: he is adopted.  Does not know  Tobacco Screening: Have you used any form of tobacco in the last 30 days? (Cigarettes, Smokeless Tobacco, Cigars, and/or Pipes): No  Social History:  Patient is married but he says that he and his wife have a very strained relationship. They don't sleep together and he feels like she is completely unsupportive. 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 Doesn't have anything that he enjoys.  Has a son who is 70.  Lives near by and they are close.  No legal trouble History  Alcohol Use No     History  Drug Use No     Allergies:   Allergies  Allergen Reactions  . Ace Inhibitors Cough   Lab Results:  Results for orders placed or performed during the hospital encounter of 12/29/16 (from the past 48 hour(s))  Lipid panel     Status: Abnormal   Collection Time: 12/30/16  6:45 AM  Result Value Ref Range   Cholesterol 160 0 - 200 mg/dL   Triglycerides 161 (H) <150 mg/dL   HDL 32 (L) >09 mg/dL   Total CHOL/HDL Ratio 5.0 RATIO   VLDL 44 (H) 0 - 40 mg/dL   LDL Cholesterol 84 0 - 99 mg/dL    Comment:        Total Cholesterol/HDL:CHD Risk Coronary Heart Disease Risk Table                     Men   Women  1/2 Average Risk   3.4   3.3  Average Risk       5.0   4.4  2 X Average Risk   9.6   7.1  3 X Average Risk  23.4   11.0        Use the calculated  Patient Ratio above and the CHD Risk Table to determine the patient's CHD Risk.        ATP III CLASSIFICATION (LDL):  <100     mg/dL   Optimal  604-540  mg/dL   Near or Above                    Optimal  130-159  mg/dL   Borderline  981-191  mg/dL   High  >478     mg/dL   Very High   TSH     Status: None   Collection Time: 12/30/16  6:45 AM  Result  Value Ref Range   TSH 2.493 0.350 - 4.500 uIU/mL    Comment: Performed by a 3rd Generation assay with a functional sensitivity of <=0.01 uIU/mL.    Blood Alcohol level:  Lab Results  Component Value Date   ETH <5 12/29/2016    Metabolic Disorder Labs:  No results found for: HGBA1C, MPG No results found for: PROLACTIN Lab Results  Component Value Date   CHOL 160 12/30/2016   TRIG 219 (H) 12/30/2016   HDL 32 (L) 12/30/2016   CHOLHDL 5.0 12/30/2016   VLDL 44 (H) 12/30/2016   LDLCALC 84 12/30/2016    Current Medications: Current Facility-Administered Medications  Medication Dose Route Frequency Provider Last Rate Last Dose  . acetaminophen (TYLENOL) tablet 650 mg  650 mg Oral Q6H PRN Audery Amel, MD      . alum & mag hydroxide-simeth (MAALOX/MYLANTA) 200-200-20 MG/5ML suspension 30 mL  30 mL Oral Q4H PRN Audery Amel, MD      . amLODipine (NORVASC) tablet 10 mg  10 mg Oral Daily Jimmy Footman, MD      . famotidine (PEPCID) tablet 20 mg  20 mg Oral QHS Audery Amel, MD      . FLUoxetine (PROZAC) capsule 20 mg  20 mg Oral Daily Audery Amel, MD   20 mg at 12/30/16 0800  . hydrochlorothiazide (HYDRODIURIL) tablet 25 mg  25 mg Oral Daily Audery Amel, MD   25 mg at 12/30/16 0800  . losartan (COZAAR) tablet 100 mg  100 mg Oral Daily Audery Amel, MD   100 mg at 12/30/16 0800  . magnesium hydroxide (MILK OF MAGNESIA) suspension 30 mL  30 mL Oral Daily PRN Audery Amel, MD      . pantoprazole (PROTONIX) EC tablet 40 mg  40 mg Oral Daily Audery Amel, MD   40 mg at 12/30/16 0800  . tamsulosin (FLOMAX)  capsule 0.4 mg  0.4 mg Oral Daily Audery Amel, MD       PTA Medications: Prescriptions Prior to Admission  Medication Sig Dispense Refill Last Dose  . esomeprazole (NEXIUM) 40 MG capsule Take 40 mg by mouth daily at 12 noon.   12/29/2016 at Unknown time  . hydrochlorothiazide (HYDRODIURIL) 25 MG tablet Take 25 mg by mouth daily.   12/28/2016 at Unknown time  . Multiple Vitamins-Minerals (CENTRUM SILVER ADULT 50+ PO) Take 1 tablet by mouth daily.   12/28/2016 at Unknown time  . ranitidine (ZANTAC) 300 MG tablet Take 300 mg by mouth at bedtime.   12/28/2016 at Unknown time  . tamsulosin (FLOMAX) 0.4 MG CAPS capsule Take 0.4 mg by mouth daily.   12/28/2016 at Unknown time  . triamcinolone cream (KENALOG) 0.1 % Apply 1 application topically 2 (two) times daily as needed (skin irritation).   Past Month at Unknown time  . amLODipine (NORVASC) 10 MG tablet Take 10 mg by mouth daily.   Not Taking at Unknown time  . Cholecalciferol (VITAMIN D3) 5000 UNITS CAPS Take 5,000 Units by mouth daily.   Not Taking at Unknown time  . losartan (COZAAR) 100 MG tablet Take 100 mg by mouth daily.    Not Taking at Unknown time  . methocarbamol (ROBAXIN) 500 MG tablet Take 1 tablet (500 mg total) by mouth every 6 (six) hours as needed for muscle spasms. (Patient not taking: Reported on 12/29/2016) 60 tablet 1 Not Taking at Unknown time  . oxyCODONE-acetaminophen (PERCOCET/ROXICET) 5-325 MG tablet Take 1-2 tablets by mouth  every 4 (four) hours as needed for moderate pain. (Patient not taking: Reported on 12/29/2016) 60 tablet 0 Not Taking at Unknown time  . traMADol (ULTRAM) 50 MG tablet Take 50-100 mg by mouth every 4 (four) hours as needed (pain).   Not Taking at Unknown time  . vitamin C (ASCORBIC ACID) 500 MG tablet Take 500 mg by mouth daily.   Not Taking at Unknown time    Musculoskeletal: Strength & Muscle Tone: within normal limits Gait & Station: normal Patient leans: N/A  Psychiatric Specialty Exam: Physical  Exam  Constitutional: He is oriented to person, place, and time. He appears well-developed and well-nourished.  HENT:  Head: Normocephalic and atraumatic.  Eyes: Conjunctivae and EOM are normal.  Neck: Normal range of motion.  Respiratory: Effort normal.  Musculoskeletal: Normal range of motion.  Neurological: He is alert and oriented to person, place, and time.    Review of Systems  Constitutional: Negative.   HENT: Negative.   Eyes: Negative.   Respiratory: Negative.   Cardiovascular: Negative.   Gastrointestinal: Negative.   Genitourinary: Negative.   Musculoskeletal: Negative.   Skin: Negative.   Neurological: Negative.   Endo/Heme/Allergies: Negative.   Psychiatric/Behavioral: Positive for depression and suicidal ideas. The patient has insomnia.     Blood pressure (!) 148/84, pulse 72, temperature 97.8 F (36.6 C), resp. rate 18, height 5' 11.5" (1.816 m), weight 125.2 kg (276 lb), SpO2 98 %.Body mass index is 37.96 kg/m.  General Appearance: Fairly Groomed  Eye Contact:  Good  Speech:  Clear and Coherent and Normal Rate  Volume:  Normal  Mood:  Dysphoric  Affect:  Blunt  Thought Process:  Linear and Descriptions of Associations: Intact  Orientation:  Full (Time, Place, and Person)  Thought Content:  Hallucinations: None  Suicidal Thoughts:  No  Homicidal Thoughts:  No  Memory:  Immediate;   Good Recent;   Good Remote;   Good  Judgement:  Fair  Insight:  Fair  Psychomotor Activity:  Decreased  Concentration:  Concentration: Good and Attention Span: Good  Recall:  Good  Fund of Knowledge:  Good  Language:  Good  Akathisia:  No  Handed:    AIMS (if indicated):     Assets:  ArchitectCommunication Skills Financial Resources/Insurance Housing Physical Health Social Support  ADL's:  Intact  Cognition:  WNL  Sleep:  Number of Hours: 5.15    Treatment Plan Summary:  66 y/o married Caucasian male who presents with new onset of depression of severe intensity and  suicidal ideation---triggered by severe burn out from work  MDD severe single episode: will start fluoxetine 20 mg po q day  Insomnia: trazodone 25 mg qhs and 25 mg qhs prn  HTN: continue norvasc 10 mg po q day, cozaar 100 mg/d and HCTZ 25 mg po qday  GERD: continue pepcid 20 mg qhs and protonix 40 mg q day  BPH: continue flomax 0.4 mg po q day  Diabetes?: pending HbA1c  Diet: 2 grams Na and carb modified  VS q day  Precautions q 3766m  Dispo: home  F/u: will need to set up f/u with psychiatrist  Labs: b12, tsh and HbA1c   Physician Treatment Plan for Primary Diagnosis: Severe major depression, single episode, without psychotic features (HCC) Long Term Goal(s): Improvement in symptoms so as ready for discharge  Short Term Goals: Ability to verbalize feelings will improve and Ability to disclose and discuss suicidal ideas  Physician Treatment Plan for Secondary Diagnosis: Principal Problem:  Severe major depression, single episode, without psychotic features (HCC) Active Problems:   Herniated cervical intervertebral disc   Hypertension   GERD (gastroesophageal reflux disease)   Back pain   BPH (benign prostatic hyperplasia)   Primary osteoarthritis of left knee   Type 2 diabetes mellitus without complication, without long-term current use of insulin (HCC)  Long Term Goal(s): Improvement in symptoms so as ready for discharge  Short Term Goals: Ability to identify changes in lifestyle to reduce recurrence of condition will improve and Ability to identify and develop effective coping behaviors will improve  I certify that inpatient services furnished can reasonably be expected to improve the patient's condition.    Jimmy Footman, MD 3/14/20186:32 PM

## 2016-12-30 NOTE — Plan of Care (Signed)
Problem: Activity: Goal: Interest or engagement in leisure activities will improve Outcome: Not Progressing Isolates to self and room

## 2016-12-30 NOTE — Progress Notes (Signed)
Recreation Therapy Notes  At approximately 2:50 pm, LRT attempted assessment. Patient declined and requested for LRT to come back tomorrow.  Jacquelynn CreeGreene,Justyna Timoney M, LRT/CTRS 12/30/2016 3:12 PM

## 2016-12-30 NOTE — Progress Notes (Signed)
Pt. 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 Pt. Says that he has not had a vacation for six year. He works about 100 hours per week. Pt. Is concerned that at his age no one can hire him and he is frustrated with his job. Chaplain encouraged Pt. To take a good care of himself, address his concerns to the president of his company or look for another job if his leadership continues to ignore his concerns.

## 2016-12-30 NOTE — [facility] Group Notes (Signed)
  [facility] LCSW Group Therapy Note  Date/Time: 12/30/16, 0930  Type of Therapy/Topic:  Group Therapy:  Emotion Regulation  Participation Level:  Did Not Attend   Mood:  Description of Group:    The purpose of this group is to assist patients in learning to regulate negative emotions and experience positive emotions. Patients will be guided to discuss ways in which they have been vulnerable to their negative emotions. These vulnerabilities will be juxtaposed with experiences of positive emotions or situations, and patients challenged to use positive emotions to combat negative ones. Special emphasis will be placed on coping with negative emotions in conflict situations, and patients will process healthy conflict resolution skills.  Therapeutic Goals: 1. Patient will identify two positive emotions or experiences to reflect on in order to balance out negative emotions:  2. Patient will label two or more emotions that they find the most difficult to experience:  3. Patient will be able to demonstrate positive conflict resolution skills through discussion or role plays:   Summary of Patient Progress:       Therapeutic Modalities:   Cognitive Behavioral Therapy Feelings Identification Dialectical Behavioral Therapy  Greg Reagyn Facemire, LCSW 

## 2016-12-30 NOTE — [facility] Counselor (Signed)
CSW attempted to complete PSA but pt declined. Garner NashGregory Nahjae Hoeg, MSW, LCSW Clinical Social Worker 12/30/2016 2:50 PM

## 2016-12-30 NOTE — Tx Team (Signed)
Initial Treatment Plan 12/30/2016 12:18 AM Joel MurdersBradley G Grabski ZOX:[id number]    PATIENT STRESSORS: Health problems Marital or family conflict Occupational concerns   PATIENT STRENGTHS: Ability for insight Average or above average intelligence General fund of knowledge Motivation for treatment/growth Supportive family/friends Work skills   PATIENT IDENTIFIED PROBLEMS: Risk for suicide  depression  Work Stress  "Nothing at this time, I can't think right now"               DISCHARGE CRITERIA:  Ability to meet basic life and health needs Improved stabilization in mood, thinking, and/or behavior Verbal commitment to aftercare and medication compliance  PRELIMINARY DISCHARGE PLAN: Attend aftercare/continuing care group Outpatient therapy  PATIENT/FAMILY INVOLVEMENT: This treatment plan has been presented to and reviewed with the patient, Joel Owens.  The patient and family have been given the opportunity to ask questions and make suggestions.  Delos HaringPhillips, Lucynda Rosano A, RN 12/30/2016, 12:18 AM

## 2016-12-30 NOTE — [facility] Suicide Risk Assessment (Signed)
[facility]   Nursing information obtained from:    Demographic factors:    Current Mental Status:    Loss Factors:    Historical Factors:    Risk Reduction Factors:      Principal Problem: Severe major depression, single episode, without psychotic features (HCC) Diagnosis:   Patient Active Problem List   Diagnosis Date Noted  . GERD (gastroesophageal reflux disease) [K21.9] 12/30/2016  . BPH (benign prostatic hyperplasia) [N40.0] 12/30/2016  . Hypertension [I10] 12/29/2016  . Severe major depression, single episode, without psychotic features (HCC) [F32.2] 12/29/2016  . Type 2 diabetes mellitus without complication, without long-term current use of insulin (HCC) [E11.9] 02/25/2016  . Herniated cervical intervertebral disc [M50.20] 07/30/2015  . Primary osteoarthritis of left knee [M17.12] 06/13/2014  . Back pain [M54.9] 07/17/2013   Subjective Data:   Continued Clinical Symptoms:  Alcohol Use Disorder Identification Test Final Score (AUDIT): 0 The "Alcohol Use Disorders Identification Test", Guidelines for Use in Primary Care, Second Edition.  World Science writerHealth [facility](WHO). Score between 0-7:  no or low risk or alcohol related problems. Score between 8-15:  moderate risk of alcohol related problems. Score between 16-19:  high risk of alcohol related problems. Score 20 or above:  warrants further diagnostic evaluation for alcohol dependence and treatment.   CLINICAL FACTORS:   Depression:   Insomnia Severe    Psychiatric Specialty Exam: Physical Exam  ROS  Blood pressure (!) 148/84, pulse 72, temperature 97.8 F (36.6 C), resp. rate 18, height 5' 11.5" (1.816 m), weight 125.2 kg (276 lb), SpO2 98 %.Body mass index is 37.96 kg/m.                                                    Sleep:  Number of Hours: 5.15      COGNITIVE FEATURES THAT CONTRIBUTE TO RISK:  Polarized thinking    SUICIDE RISK:   Moderate:   Frequent suicidal ideation with limited intensity, and duration, some specificity in terms of plans, no associated intent, good self-control, limited dysphoria/symptomatology, some risk factors present, and identifiable protective factors, including available and accessible social support.  [facility]  I certify that inpatient services furnished can reasonably be expected to improve the patient's condition.   Jimmy FootmanHernandez-Gonzalez,  Dewel Lotter, MD 12/30/2016, 1:48 PM

## 2016-12-30 NOTE — Progress Notes (Signed)
Recreation Therapy Notes  Date: 03.14.18 Time: 1:00 pm Location: Craft Room  Group Topic: Self-esteem  Goal Area(s) Addresses:  Patient will write at least one positive trait about self. Patient will verbalize benefit of having healthy self-esteem.  Behavioral Response: Did not attend  Intervention: I Am  Activity: Patients were given a worksheet with the letter I on it and were instructed to write as many positive traits about themselves inside the letter.  Education: LRT educated patients on ways they can increase their self-esteem.  Education Outcome: Patient did not attend group.  Clinical Observations/Feedback: Patient did not attend group.  Carver Murakami M, LRT/CTRS 12/30/2016 1:56 PM 

## 2016-12-30 NOTE — Progress Notes (Signed)
Patient refusing all medications, refused to get out of bed. States "Im not taking anymore medications or getting up" "Im tired of taking meds". Pt did not come out of room. Irritable. Tells staff different scenarios. Encouraged patient to come out of room and attend groups and verbalize feelings. Pt continues to be isolative to room. Pt remains safe on unit with q 15 min checks.

## 2016-12-30 NOTE — Progress Notes (Signed)
Admission Note:  D:65 yr male who presents VC in no acute distress for the treatment of SI and Depression. Pt appears flat and depressed. Pt was calm and cooperative with admission process. Pt presents with passive SI and contracts for safety upon admission. Pt denies AVH . Pt stated he has been depressed x 1 year , but the environment at work has steadily been increasing his stress levels. Pt stated he lost it the day after thanksgiving when he was supposed to be off and they continued to call him and even on his vacations they constantly call him, so he don't take vacations anymore due to not being to enjoy them anyway. "I can't see the light at the end of the tunnel" pt stated the work environment has been increasingly more hostile due to his bosses. "I feel out of control and I have always had a job when I've been in control". 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 "I feel humiliated coming in here, I feel guilty because my partners have to take over my work". "My mental and physical health is declining".   A: Skin was assessed and found to be clear of any abnormal marks apart from surgical scar on neck, back, both knees. PT searched and no contraband found, POC and unit policies explained and understanding verbalized. Consents obtained. Food and fluids offered, and fluids accepted.    R:Pt had no additional questions or concerns.

## 2016-12-31 LAB — HEMOGLOBIN A1C
HEMOGLOBIN A1C: 6.9 % — AB (ref 4.8–5.6)
MEAN PLASMA GLUCOSE: 151 mg/dL

## 2016-12-31 LAB — VITAMIN B12: Vitamin B-12: 454 pg/mL (ref 180–914)

## 2016-12-31 MED ORDER — TRAZODONE HCL 50 MG PO TABS
75.0000 mg | ORAL_TABLET | Freq: Every day | ORAL | Status: DC
  Administered 2016-12-31: 75 mg via ORAL
  Filled 2016-12-31 (×3): qty 1

## 2016-12-31 MED ORDER — TRAZODONE HCL 50 MG PO TABS
25.0000 mg | ORAL_TABLET | Freq: Once | ORAL | Status: AC
  Administered 2016-12-31: 25 mg via ORAL
  Filled 2016-12-31: qty 1

## 2016-12-31 MED ORDER — METFORMIN HCL 500 MG PO TABS
500.0000 mg | ORAL_TABLET | Freq: Two times a day (BID) | ORAL | Status: DC
  Administered 2017-01-01 – 2017-01-04 (×7): 500 mg via ORAL
  Filled 2016-12-31 (×8): qty 1

## 2016-12-31 NOTE — Progress Notes (Signed)
Recreation Therapy Notes  Date: 03.15.18 Time: 1:00 pm Location: Craft Room  Group Topic: Leisure Education  Goal Area(s) Addresses:  Patient will identify activities for each letter of the alphabet. Patient will verbalize ability to integrate positive leisure into life post d/c. Patient will verbalize ability to use leisure as a Associate Professorcoping skill.  Behavioral Response: Did not attend  Intervention: Leisure Bucket List  Activity: Patients were given a Leisure Geneticist, molecularBucket List worksheet and were instructed to write healthy leisure activities they wanted to try and to write a leisure goal for the year.  Education: LRT educated patients on what they need to participate in leisure.  Education Outcome: Patient did not attend group.  Clinical Observations/Feedback: Patient did not attend group.  Jacquelynn CreeGreene,Ousmane Seeman M, LRT/CTRS 12/31/2016 1:52 PM

## 2016-12-31 NOTE — [facility] Group Notes (Signed)
[facility] LCSW Group Therapy Note  Date/Time: 12/31/16, 0930  Type of Therapy/Topic:  Group Therapy:  Balance in Life  Participation Level:  Pt did not attend group.  Description of Group:    This group will address the concept of balance and how it feels and looks when one is unbalanced. Patients will be encouraged to process areas in their lives that are out of balance, and identify reasons for remaining unbalanced. Facilitators will guide patients utilizing problem- solving interventions to address and correct the stressor making their life unbalanced. Understanding and applying boundaries will be explored and addressed for obtaining  and maintaining a balanced life. Patients will be encouraged to explore ways to assertively make their unbalanced needs known to significant others in their lives, using other group members and facilitator for support and feedback.  Therapeutic Goals: 1. Patient will identify two or more emotions or situations they have that consume much of in their lives. 2. Patient will identify signs/triggers that life has become out of balance:  3. Patient will identify two ways to set boundaries in order to achieve balance in their lives:  4. Patient will demonstrate ability to communicate their needs through discussion and/or role plays  Summary of Patient Progress:          Therapeutic Modalities:   Cognitive Behavioral Therapy Solution-Focused Therapy Assertiveness Training  Greg Kadee Philyaw, LCSW 

## 2016-12-31 NOTE — [facility] Counselor (Signed)
Adult Comprehensive Assessment  Patient ID: Joel Owens, male   DOB: July 07, 1951, 66 y.o.   MRN: [id number]  Information Source: Information source: Patient  Current Stressors:  Educational / Learning stressors: n/a Employment / Job issues: Pt is currently employed and states most of his stress is related to work.  Family Relationships: n/a Surveyor, quantity / Lack of resources (include bankruptcy): n/a Housing / Lack of housing: n/a Physical health (include injuries & life threatening diseases): Pt reports his vision is starting to get bad.  Social relationships: Pt has several close friends from work. Substance abuse: Patient denies Bereavement / Loss: n/a  Living/Environment/Situation:  Living Arrangements: Spouse/significant other Living conditions (as described by patient or guardian): Lived with wife over 30 years. How long has patient lived in current situation?: Pt states they don't communicate often.  What is atmosphere in current home: Comfortable  Family History:  Marital status: Married Number of Years Married: 35 What types of issues is patient dealing with in the relationship?: Pt states his wife hold grudges and she hasn't spoken to him in years Additional relationship information: n/a Are you sexually active?: No What is your sexual orientation?: heterosexual Has your sexual activity been affected by drugs, alcohol, medication, or emotional stress?: n/a Does patient have children?: Yes How many children?: 1 How is patient's relationship with their children?: 1 son and patient states they have a good relationship.   Childhood History:  By whom was/is the patient raised?: Adoptive parents Additional childhood history information: n/a Description of patient's relationship with caregiver when they were a child: Pt states he had a wonderful relationship with them.  Patient's description of current relationship with people who raised him/her: Both parents are deceased.   How were you disciplined when you got in trouble as a child/adolescent?: n/a Does patient have siblings?: No Did patient suffer any verbal/emotional/physical/sexual abuse as a child?: No Did patient suffer from severe childhood neglect?: No Has patient ever been sexually abused/assaulted/raped as an adolescent or adult?: No Was the patient ever a victim of a crime or a disaster?: No Witnessed domestic violence?: No Has patient been effected by domestic violence as an adult?: No  Education:  Highest grade of school patient has completed: Some Automotive engineer Currently a student?: No Name of school: n/a Learning disability?: No  Employment/Work Situation:   Employment situation: Employed 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  What is the longest time patient has a held a job?: 19 years Where was the patient employed at that time?: [employer] Has patient ever been in the [employer]?: Yes (Describe in comment) (Pt was in the [employer] [phone]) Has patient ever served in combat?: Yes Patient description of combat service: [place] Era Did You Receive Any Psychiatric Treatment/Services While in the [employer]?: No Are There Guns or Other Weapons in Your Home?: No Are These Weapons Safely Secured?:  (n/a)  Financial Resources:   Financial resources: Income from employment, Private insurance Does patient have a representative payee or guardian?: No  Alcohol/Substance Abuse:   What has been your use of drugs/alcohol within the last 12 months?: Patient denies If attempted suicide, did drugs/alcohol play a role in this?: No Alcohol/Substance Abuse Treatment Hx: Denies past history Has alcohol/substance abuse ever caused legal problems?: No  Social Support System:   Patient's  Community  Support System: Fair Museum/gallery exhibitions officerDescribe Community Support System: Pt states he has support from his son.  Type of faith/religion: Christianity How does patient's faith help to cope with current illness?: n/a  Leisure/Recreation:   Leisure and Hobbies: Traveling, watching talent shows  Strengths/Needs:   What things does the patient do well?: problem solving, hard worker In what areas does patient struggle / problems for patient: insomnia, depression  Discharge Plan:   Does patient have access to transportation?: Yes Will patient be returning to same living situation after discharge?: Yes Currently receiving community mental health services: No If no, would patient like referral for services when discharged?: Yes (What county?) [facility] Does patient have financial barriers related to discharge medications?: No  Summary/Recommendations:   Patient is a 66 year old male admitted voluntarily with a diagnosis of Severe major depression, single episode, without psychotic features. Information was obtained from psychosocial assessment completed with patient and chart review conducted by this evaluator. Patient presented to the hospital after PCP recommended him to come for further evaluation. Patient reports primary triggers for admission was stress from work and worsening depression and insomnia. Patient will benefit from crisis stabilization, medication evaluation, group therapy and psycho education in addition to case management for discharge. At discharge, it is recommended that patient remain compliant with established discharge plan and continued treatment.   Ruchi Stoney G. Garnette CzechSampson MSW, [facility] ServicesCSWA 12/31/2016 11:24 AM

## 2016-12-31 NOTE — [facility] Suicide Risk Assessment (Signed)
[facility] INPATIENT:  Family/Significant Other Suicide Prevention Education  Suicide Prevention Education:  Patient Refusal for Family/Significant Other Suicide Prevention Education: The patient Joel Owens has refused to provide written consent for family/significant other to be provided Family/Significant Other Suicide Prevention Education during admission and/or prior to discharge.  Physician notified.  Adler Chartrand G. Garnette CzechSampson MSW, LCSWA 12/31/2016, 11:15 AM

## 2016-12-31 NOTE — Progress Notes (Signed)
[facility]  12/31/2016 11:16 AM Joel Owens  MRN:  [id number] Subjective:  66 year old man Who came to our emergency department voluntarily on March 13. He was referred to the ER by his primary care doctor Dr. Zada Finderslmedo due to suicidal ideation.  "I am having trouble coping" Patient stated he has had SI for " a while" with several different plans--he would drive into a tree or take sleeping pills.   His job is the cause of his mental and emotional state. He's been working for the same [employer] for approximately 19 years. Approximately three years ago, their CEO reduced their workforce and it resulted in all his support staff been let go. He works more than 60 hours a week, because he have to do the job of five other people. He also has not taking any vacation in 6 years  The last three months have been extremely stressful for him. He is having the thoughts of running his car into a tree and overdosing on sleeping pills. The thoughts are increasing and they are now occurring "every hour on the hour." His sleep has decreased and his appetite fluctuates. When asked what's keeping him from ending his life, he stated "I don't know." He lives in the same home with his wife but their relationship is distant.    Substance abuse history: He says he drinks very little maybe 4 drinks total in a year no history of drug abuse. He is a former smoker  3/15 Patient reports he continues to feel depressed today, still having suicidal thoughts of running off the road or taking sleeping pills. Reports poor, interrupted sleep last night despite taking Trazadone 25 mg. Reports good appetite. Denies HI, auditory/visual hallucinations, side effects to meds. Attended 1 group session yesterday, feels like they dont help him. Says the bed hurts his back and his left leg also hurts due to the back pain.  Per nursing  Patient remains depressed and sad. Flat affect. Isolative and guarded. Rates  depression level 6/10(0low-10 worst). States he still feeing sad. Voiced passive SI, with no plan. Remains in bed except for medications and snack. Limited interaction with peers and staff. Med compliant. No PRNs give. Med adm w/education. Safety maintained with q 15 min checks. Pt remains free from harm.   Patient refusing all medications, refused to get out of bed. States "Im not taking anymore medications or getting up" "Im tired of taking meds". Pt did not come out of room. Irritable. Tells staff different scenarios. Encouraged patient to come out of room and attend groups and verbalize feelings. Pt continues to be isolative to room. Pt remains safe on unit with q 15 min checks.  Principal Problem: Severe major depression, single episode, without psychotic features (HCC) Diagnosis:   Patient Active Problem List   Diagnosis Date Noted  . GERD (gastroesophageal reflux disease) [K21.9] 12/30/2016  . BPH (benign prostatic hyperplasia) [N40.0] 12/30/2016  . Hypertension [I10] 12/29/2016  . Severe major depression, single episode, without psychotic features (HCC) [F32.2] 12/29/2016  . Type 2 diabetes mellitus without complication, without long-term current use of insulin (HCC) [E11.9] 02/25/2016  . Herniated cervical intervertebral disc [M50.20] 07/30/2015  . Primary osteoarthritis of left knee [M17.12] 06/13/2014  . Back pain [M54.9] 07/17/2013   Total Time spent with patient: 20 minutes  Past Psychiatric History: No past history of psychiatric treatment. No hospitalization. No suicide attempts. Never been on any psychiatric medicine. No history of psychotic symptoms or substance abuse.  Past  Medical History:  Past Medical History:  Diagnosis Date  . GERD (gastroesophageal reflux disease)   . Heart murmur    as child   no problems  . Hypertension   . Shortness of breath dyspnea    with exertion   . Sleep apnea    never got cpap     Past Surgical History:  Procedure Laterality Date   . ANTERIOR CERVICAL DECOMP/DISCECTOMY FUSION N/A 07/30/2015   Procedure: C4-5 C5-6 C6-7 Anterior cervical decompression/diskectomy/fusion;  Surgeon: Maeola Harman, MD;  Location: [facility];  Service: Neurosurgery;  Laterality: N/A;  C4-5 C5-6 C6-7 Anterior cervical decompression/diskectomy/fusion  . BACK SURGERY     1980s  . KNEE ARTHROPLASTY     bilat   . ROTATOR CUFF REPAIR     left   . TONSILLECTOMY     Family History: History reviewed. No pertinent family history.  Family Psychiatric  History: he is adopted.  Does not know  Social History: Patient is married but he says that he and his wife have a very strained relationship. They don't sleep together and he feels like she is completely unsupportive. 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 Doesn't have anything that he enjoys.  Has a son who is 72.  Lives near by and they are close. History  Alcohol Use No     History  Drug Use No    Social History   Social History  . Marital status: Married    Spouse name: N/A  . Number of children: N/A  . Years of education: N/A   Social History Main Topics  . Smoking status: Former Smoker    Years: 5.00  . Smokeless tobacco: Never Used  . Alcohol use No  . Drug use: No  . Sexual activity: No   Other Topics Concern  . None   Social History Narrative  . None   Additional Social History:    Patient is married but he says that he and his wife have a very strained relationship. They don't sleep together and he feels like she is completely unsupportive. 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 Doesn't have anything that he enjoys.  Has a son who is 37.  Lives near by and they are close.  No legal trouble     Sleep: Poor  Appetite:  Fair  Current Medications: Current Facility-Administered Medications  Medication Dose Route Frequency Provider Last Rate Last Dose  . acetaminophen  (TYLENOL) tablet 650 mg  650 mg Oral Q6H PRN Audery Amel, MD      . alum & mag hydroxide-simeth (MAALOX/MYLANTA) 200-200-20 MG/5ML suspension 30 mL  30 mL Oral Q4H PRN Audery Amel, MD      . amLODipine (NORVASC) tablet 10 mg  10 mg Oral Daily Jimmy Footman, MD   10 mg at 12/31/16 0936  . famotidine (PEPCID) tablet 20 mg  20 mg Oral QHS Audery Amel, MD   20 mg at 12/30/16 2145  . FLUoxetine (PROZAC) capsule 20 mg  20 mg Oral Daily Audery Amel, MD   20 mg at 12/31/16 0936  . hydrochlorothiazide (HYDRODIURIL) tablet 25 mg  25 mg Oral Daily Audery Amel, MD   25 mg at 12/31/16 0936  . losartan (COZAAR) tablet 100 mg  100 mg Oral Daily Audery Amel, MD   100 mg at 12/31/16 0936  . magnesium hydroxide (MILK OF MAGNESIA)  suspension 30 mL  30 mL Oral Daily PRN Audery Amel, MD      . metFORMIN (GLUCOPHAGE) tablet 500 mg  500 mg Oral BID WC Jimmy Footman, MD      . pantoprazole (PROTONIX) EC tablet 40 mg  40 mg Oral Daily Audery Amel, MD   40 mg at 12/31/16 0936  . tamsulosin (FLOMAX) capsule 0.4 mg  0.4 mg Oral Daily Audery Amel, MD   0.4 mg at 12/31/16 0936  . traZODone (DESYREL) tablet 75 mg  75 mg Oral QHS Jimmy Footman, MD        Lab Results:  Results for orders placed or performed during the hospital encounter of 12/29/16 (from the past 48 hour(s))  Hemoglobin A1c     Status: Abnormal   Collection Time: 12/30/16  6:45 AM  Result Value Ref Range   Hgb A1c MFr Bld 6.9 (H) 4.8 - 5.6 %    Comment: (NOTE)         Pre-diabetes: 5.7 - 6.4         Diabetes: >6.4         Glycemic control for adults with diabetes: <7.0    Mean Plasma Glucose 151 mg/dL    Comment: (NOTE) Performed At: [facility] [address] [place], [place] [id number] Mila Homer MD OZ:[id number]   Lipid panel     Status: Abnormal   Collection Time: 12/30/16  6:45 AM  Result Value Ref Range   Cholesterol 160 0 - 200 mg/dL   Triglycerides 629 (H) <150  mg/dL   HDL 32 (L) >52 mg/dL   Total CHOL/HDL Ratio 5.0 RATIO   VLDL 44 (H) 0 - 40 mg/dL   LDL Cholesterol 84 0 - 99 mg/dL    Comment:        Total Cholesterol/HDL:CHD Risk Coronary Heart Disease Risk Table                     Men   Women  1/2 Average Risk   3.4   3.3  Average Risk       5.0   4.4  2 X Average Risk   9.6   7.1  3 X Average Risk  23.4   11.0        Use the calculated Patient Ratio above and the CHD Risk Table to determine the patient's CHD Risk.        ATP III CLASSIFICATION (LDL):  <100     mg/dL   Optimal  841-324  mg/dL   Near or Above                    Optimal  130-159  mg/dL   Borderline  401-027  mg/dL   High  >253     mg/dL   Very High   TSH     Status: None   Collection Time: 12/30/16  6:45 AM  Result Value Ref Range   TSH 2.493 0.350 - 4.500 uIU/mL    Comment: Performed by a 3rd Generation assay with a functional sensitivity of <=0.01 uIU/mL.  Vitamin B12     Status: None   Collection Time: 12/30/16  6:45 AM  Result Value Ref Range   Vitamin B-12 454 180 - 914 pg/mL    Comment: (NOTE) This assay is not validated for testing neonatal or myeloproliferative syndrome specimens for Vitamin B12 levels. Performed at [facility], [address] [place], [place] [address]  Blood Alcohol level:  Lab Results  Component Value Date   ETH <5 12/29/2016    Metabolic Disorder Labs: Lab Results  Component Value Date   HGBA1C 6.9 (H) 12/30/2016   MPG 151 12/30/2016   No results found for: PROLACTIN Lab Results  Component Value Date   CHOL 160 12/30/2016   TRIG 219 (H) 12/30/2016   HDL 32 (L) 12/30/2016   CHOLHDL 5.0 12/30/2016   VLDL 44 (H) 12/30/2016   LDLCALC 84 12/30/2016    Physical Findings: AIMS: Facial and Oral Movements Muscles of Facial Expression: None, normal Lips and Perioral Area: None, normal Jaw: None, normal Tongue: None, normal,Extremity Movements Upper (arms, wrists, hands, fingers): None, normal Lower  (legs, knees, ankles, toes): None, normal, Trunk Movements Neck, shoulders, hips: None, normal, Overall Severity Severity of abnormal movements (highest score from questions above): None, normal Incapacitation due to abnormal movements: None, normal Patient's awareness of abnormal movements (rate only patient's report): No Awareness, Dental Status Current problems with teeth and/or dentures?: No Does patient usually wear dentures?: No  CIWA:  CIWA-Ar Total: 0 COWS:     Musculoskeletal: Strength & Muscle Tone: within normal limits Gait & Station: normal Patient leans: N/A  Psychiatric Specialty Exam: Physical Exam  Constitutional: He is oriented to person, place, and time. He appears well-developed and well-nourished.  HENT:  Head: Normocephalic.  Eyes: EOM are normal.  Musculoskeletal: Normal range of motion.  Neurological: He is alert and oriented to person, place, and time.  Psychiatric: He has a normal mood and affect. His behavior is normal. Judgment and thought content normal.    Review of Systems  Constitutional: Negative.   HENT: Negative.   Eyes: Negative.   Respiratory: Negative.   Cardiovascular: Negative.   Gastrointestinal: Negative.   Genitourinary: Negative.   Musculoskeletal: Positive for back pain.  Skin: Negative.   Neurological: Negative.   Endo/Heme/Allergies: Negative.   Psychiatric/Behavioral: Positive for depression and suicidal ideas. The patient is nervous/anxious and has insomnia.     Blood pressure (!) 149/70, pulse 71, temperature 97.9 F (36.6 C), temperature source Oral, resp. rate 18, height 5' 11.5" (1.816 m), weight 125.2 kg (276 lb), SpO2 98 %.Body mass index is 37.96 kg/m.  General Appearance: Fairly Groomed  Eye Contact:  Good  Speech:  Clear and Coherent and Normal Rate  Volume:  Normal  Mood:  Depressed and Hopeless  Affect:  Appropriate and Depressed  Thought Process:  Linear  Orientation:  Full (Time, Place, and Person)  Thought  Content:  Logical  Suicidal Thoughts:  Yes.  with intent/plan  Homicidal Thoughts:  No  Memory:  Immediate;   Good Recent;   Good Remote;   Good  Judgement:  Intact  Insight:  Good  Psychomotor Activity:  Normal  Concentration:  Concentration: Good and Attention Span: Good  Recall:  Good  Fund of Knowledge:  Good  Language:  Good  Akathisia:  No  Handed:    AIMS (if indicated):     Assets:  Communication Skills Desire for Improvement Physical Health Resilience  ADL's:  Intact  Cognition:  WNL  Sleep:  Number of Hours: 6.45     Treatment Plan Summary: Daily contact with patient to assess and evaluate symptoms and progress in treatment    Patient is still suicidal. No improvement since admission. Was unable to sleep well last night. Is still hopeless and helpless  66 y/o married Caucasian male who presents with new onset of depression of severe intensity and suicidal  ideation---triggered by severe burn out from work  MDD severe single episode: Patient has been started on fluoxetine 20 mg po q day  Insomnia: Will increase trazodone to trazodone 75 mg qhs  HTN: continue norvasc 10 mg po q day, cozaar 100 mg/d and HCTZ 25 mg po qday  GERD: continue pepcid 20 mg qhs and protonix 40 mg q day  BPH: continue flomax 0.4 mg po q day  Diabetes: Start metformin 500 mg BID  Diet: 2 grams Na and carb modified  VS q day  Precautions q 30m  Dispo: home once pt more stable  F/u: will need to set up f/u with psychiatrist  Labs: b12 WNL, tsh WNL, lipids (high TGs and VLDL, low HDL) and HbA1c elevated (6.9%)  Jimmy Footman, MD 12/31/2016, 11:16 AM

## 2016-12-31 NOTE — Plan of Care (Signed)
Problem: Safety: Goal: Ability to remain free from injury will improve Patient remains depressed and sad. Flat affect. Isolative and guarded. Rates depression level 6/10(0low-10 worst). States he still feeing sad. Voiced passive SI, with no plan. Remains in bed except for medications and snack. Limited interaction with peers and staff. Med compliant. No PRNs give. Med adm w/education. Safety maintained with q 15 min checks. Pt remains free from harm.

## 2016-12-31 NOTE — Progress Notes (Signed)
Recreation Therapy Notes  INPATIENT RECREATION THERAPY ASSESSMENT  Patient Details Name: Joel Owens MRN: [id number] DOB: 1950-11-20 Today's Date: 12/31/2016  Patient Stressors: Work (Moved from one position to another and did not want to but his complany was cuttin ghis first position - works round the clock and has to deal with customer's complaints)  Coping Skills:   Isolate, Talking, Music, Other (Comment) (Take it for what it is worth and move on)  Personal Challenges: Expressing Yourself, Self-Esteem/Confidence, Stress Management, Time Management  Leisure Interests (2+):  Individual - Other (Comment) (Travel, YouTube - talent shows)  Awareness of Community Resources:  No  Community Resources:     Current Use:    If no, Barriers?:    Patient Strengths:  Generous, helpful  Patient Identified Areas of Improvement:  Self-confidence  Current Recreation Participation:  Has not had any leisure time in a long time  Patient Goal for Hospitalization:  To get out  [place]  [place] Residence:  [place]   Current SI (including self-harm):  Yes  Current HI:  No  Consent to Intern Participation: N/A   Jacquelynn CreeGreene,Parris Cudworth M, LRT/CTRS 12/31/2016, 3:47 PM

## 2016-12-31 NOTE — Progress Notes (Signed)
Affect flat.   Continues to endorse passive SI.  No specific plan.  Isolates to his room. No verbalized goal.  States that life's stressors has just gotten to much for him.  Support and encouragement offered.  Safety checks maintained.

## 2017-01-01 LAB — GLUCOSE, CAPILLARY: Glucose-Capillary: 122 mg/dL — ABNORMAL HIGH (ref 65–99)

## 2017-01-01 MED ORDER — CLONAZEPAM 0.5 MG PO TABS
0.5000 mg | ORAL_TABLET | Freq: Two times a day (BID) | ORAL | Status: DC | PRN
  Administered 2017-01-01: 0.5 mg via ORAL
  Filled 2017-01-01: qty 1

## 2017-01-01 MED ORDER — TRAZODONE HCL 50 MG PO TABS
150.0000 mg | ORAL_TABLET | Freq: Every day | ORAL | Status: DC
  Administered 2017-01-01 – 2017-01-03 (×3): 150 mg via ORAL
  Filled 2017-01-01 (×3): qty 1

## 2017-01-01 NOTE — Progress Notes (Addendum)
D: Pt is alert and oriented x 4, denies SI/HI/AVH. Pt is pleasant and cooperative, affect is sad but he brightens up when approached and during a conversation. Pt appears less anxious and he is interacting with peers and staff appropriately. Patient rated depression a 6/10.  A: Pt was offered support and encouragement. Pt was given scheduled medications. Pt was encouraged to attend groups. Q 15 minute checks were done for safety.  R:Pt attends groups and interacts well with peers and staff. Pt is taking medication. Pt has no complaints.Pt receptive to treatment and safety maintained on unit.

## 2017-01-01 NOTE — [facility] Group Notes (Signed)
[facility] LCSW Group Therapy Note  Date/Time: 01/01/17, 0930  Type of Therapy and Topic:  Group Therapy:  Feelings around Relapse and Recovery  Participation Level:  Did Not Attend   Mood:  Description of Group:    Patients in this group will discuss emotions they experience before and after a relapse. They will process how experiencing these feelings, or avoidance of experiencing them, relates to having a relapse. Facilitator will guide patients to explore emotions they have related to recovery. Patients will be encouraged to process which emotions are more powerful. They will be guided to discuss the emotional reaction significant others in their lives may have to patients' relapse or recovery. Patients will be assisted in exploring ways to respond to the emotions of others without this contributing to a relapse.  Therapeutic Goals: 1. Patient will identify two or more emotions that lead to relapse for them:  2. Patient will identify two emotions that result when they relapse:  3. Patient will identify two emotions related to recovery:  4. Patient will demonstrate ability to communicate their needs through discussion and/or role plays.   Summary of Patient Progress:     Therapeutic Modalities:   Cognitive Behavioral Therapy Solution-Focused Therapy Assertiveness Training Relapse Prevention Therapy  Daleen SquibbGreg Aylssa Herrig, LCSW

## 2017-01-01 NOTE — Progress Notes (Signed)
[facility] MD Progress Note  01/01/2017 11:09 AM Joel Owens  MRN:  [id number] Subjective:  66 year old man Who came to our emergency department voluntarily on March 13. He was referred to the ER by his primary care doctor Dr. Zada Finders due to suicidal ideation.  "I am having trouble coping" Patient stated he has had SI for " a while" with several different plans--he would drive into a tree or take sleeping pills.   His job is the cause of his mental and emotional state. He's been working for the same [employer] for approximately 19 years. Approximately three years ago, their CEO reduced their workforce and it resulted in all his support staff been let go. He works more than 60 hours a week, because he have to do the job of five other people. He also has not taking any vacation in 6 years  The last three months have been extremely stressful for him. He is having the thoughts of running his car into a tree and overdosing on sleeping pills. The thoughts are increasing and they are now occurring "every hour on the hour." His sleep has decreased and his appetite fluctuates. When asked what's keeping him from ending his life, he stated "I don't know." He lives in the same home with his wife but their relationship is distant.    Substance abuse history: He says he drinks very little maybe 4 drinks total in a year no history of drug abuse. He is a former smoker  3/15 Patient reports he continues to feel depressed today, still having suicidal thoughts of running off the road or taking sleeping pills. Reports poor, interrupted sleep last night despite taking Trazadone 25 mg. Reports good appetite. Denies HI, auditory/visual hallucinations, side effects to meds. Attended 1 group session yesterday, feels like they dont help him. Says the bed hurts his back and his left leg also hurts due to the back pain.  3/16 Pt says he barely slept last night even though his Trazadone dose was increased to 100 mg.  He also still feels depressed and continues to have suicidal thoughts whenever he thinks about his issues at work. He feels very guilty about his coworkers having to cover for him when they are already burn out.  Patient did not attend any groups in the day yesterday. He attended one group in the evening only. He has not been able to sleep over the last 2 evenings. His participation in groups has been minimal.  Per nursing  Patient remains depressed and sad. Flat affect. Isolative and guarded. Rates depression level 6/10(0low-10 worst). States he still feeing sad. Voiced passive SI, with no plan. Remains in bed except for medications and snack. Limited interaction with peers and staff. Med compliant. No PRNs give. Med adm w/education. Safety maintained with q 15 min checks. Pt remains free from harm.   Patient refusing all medications, refused to get out of bed. States "Im not taking anymore medications or getting up" "Im tired of taking meds". Pt did not come out of room. Irritable. Tells staff different scenarios. Encouraged patient to come out of room and attend groups and verbalize feelings. Pt continues to be isolative to room. Pt remains safe on unit with q 15 min checks.  Principal Problem: Severe major depression, single episode, without psychotic features (HCC) Diagnosis:   Patient Active Problem List   Diagnosis Date Noted  . GERD (gastroesophageal reflux disease) [K21.9] 12/30/2016  . BPH (benign prostatic hyperplasia) [N40.0] 12/30/2016  . Hypertension [I10]  12/29/2016  . Severe major depression, single episode, without psychotic features (HCC) [F32.2] 12/29/2016  . Type 2 diabetes mellitus without complication, without long-term current use of insulin (HCC) [E11.9] 02/25/2016  . Herniated cervical intervertebral disc [M50.20] 07/30/2015  . Primary osteoarthritis of left knee [M17.12] 06/13/2014  . Back pain [M54.9] 07/17/2013   Total Time spent with patient: 20 minutes  Past  Psychiatric History: No past history of psychiatric treatment. No hospitalization. No suicide attempts. Never been on any psychiatric medicine. No history of psychotic symptoms or substance abuse.  Past Medical History:  Past Medical History:  Diagnosis Date  . GERD (gastroesophageal reflux disease)   . Heart murmur    as child   no problems  . Hypertension   . Shortness of breath dyspnea    with exertion   . Sleep apnea    never got cpap     Past Surgical History:  Procedure Laterality Date  . ANTERIOR CERVICAL DECOMP/DISCECTOMY FUSION N/A 07/30/2015   Procedure: C4-5 C5-6 C6-7 Anterior cervical decompression/diskectomy/fusion;  Surgeon: Maeola Harman, MD;  Location: [facility];  Service: Neurosurgery;  Laterality: N/A;  C4-5 C5-6 C6-7 Anterior cervical decompression/diskectomy/fusion  . BACK SURGERY     1980s  . KNEE ARTHROPLASTY     bilat   . ROTATOR CUFF REPAIR     left   . TONSILLECTOMY     Family History: History reviewed. No pertinent family history.  Family Psychiatric  History: he is adopted.  Does not know  Social History: Patient is married but he says that he and his wife have a very strained relationship. They don't sleep together and he feels like she is completely unsupportive. 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 Doesn't have anything that he enjoys.  Has a son who is 8.  Lives near by and they are close. History  Alcohol Use No     History  Drug Use No    Social History   Social History  . Marital status: Married    Spouse name: N/A  . Number of children: N/A  . Years of education: N/A   Social History Main Topics  . Smoking status: Former Smoker    Years: 5.00  . Smokeless tobacco: Never Used  . Alcohol use No  . Drug use: No  . Sexual activity: No   Other Topics Concern  . None   Social History Narrative  . None   Additional Social History:    Patient is married but he says that he and his  wife have a very strained relationship. They don't sleep together and he feels like she is completely unsupportive. 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 Doesn't have anything that he enjoys.  Has a son who is 22.  Lives near by and they are close.  No legal trouble     Sleep: Poor  Appetite:  Fair  Current Medications: Current Facility-Administered Medications  Medication Dose Route Frequency Provider Last Rate Last Dose  . acetaminophen (TYLENOL) tablet 650 mg  650 mg Oral Q6H PRN Audery Amel, MD      . alum & mag hydroxide-simeth (MAALOX/MYLANTA) 200-200-20 MG/5ML suspension 30 mL  30 mL Oral Q4H PRN Audery Amel, MD      . amLODipine (NORVASC) tablet 10 mg  10 mg Oral Daily Jimmy Footman, MD   10 mg at 01/01/17 0912  . clonazePAM (KLONOPIN) tablet  0.5 mg  0.5 mg Oral BID PRN Jimmy Footman, MD      . famotidine (PEPCID) tablet 20 mg  20 mg Oral QHS Audery Amel, MD   20 mg at 12/31/16 2205  . FLUoxetine (PROZAC) capsule 20 mg  20 mg Oral Daily Audery Amel, MD   20 mg at 01/01/17 0912  . hydrochlorothiazide (HYDRODIURIL) tablet 25 mg  25 mg Oral Daily Audery Amel, MD   25 mg at 01/01/17 0911  . losartan (COZAAR) tablet 100 mg  100 mg Oral Daily Audery Amel, MD   100 mg at 01/01/17 0912  . magnesium hydroxide (MILK OF MAGNESIA) suspension 30 mL  30 mL Oral Daily PRN Audery Amel, MD      . metFORMIN (GLUCOPHAGE) tablet 500 mg  500 mg Oral BID WC Jimmy Footman, MD   500 mg at 01/01/17 0911  . pantoprazole (PROTONIX) EC tablet 40 mg  40 mg Oral Daily Audery Amel, MD   40 mg at 01/01/17 0912  . tamsulosin (FLOMAX) capsule 0.4 mg  0.4 mg Oral Daily Audery Amel, MD   0.4 mg at 01/01/17 0912  . traZODone (DESYREL) tablet 150 mg  150 mg Oral QHS Jimmy Footman, MD        Lab Results:  Results for orders placed or performed during the hospital encounter of 12/29/16 (from the  past 48 hour(s))  Glucose, capillary     Status: Abnormal   Collection Time: 01/01/17  6:14 AM  Result Value Ref Range   Glucose-Capillary 122 (H) 65 - 99 mg/dL    Blood Alcohol level:  Lab Results  Component Value Date   ETH <5 12/29/2016    Metabolic Disorder Labs: Lab Results  Component Value Date   HGBA1C 6.9 (H) 12/30/2016   MPG 151 12/30/2016   No results found for: PROLACTIN Lab Results  Component Value Date   CHOL 160 12/30/2016   TRIG 219 (H) 12/30/2016   HDL 32 (L) 12/30/2016   CHOLHDL 5.0 12/30/2016   VLDL 44 (H) 12/30/2016   LDLCALC 84 12/30/2016    Physical Findings: AIMS: Facial and Oral Movements Muscles of Facial Expression: None, normal Lips and Perioral Area: None, normal Jaw: None, normal Tongue: None, normal,Extremity Movements Upper (arms, wrists, hands, fingers): None, normal Lower (legs, knees, ankles, toes): None, normal, Trunk Movements Neck, shoulders, hips: None, normal, Overall Severity Severity of abnormal movements (highest score from questions above): None, normal Incapacitation due to abnormal movements: None, normal Patient's awareness of abnormal movements (rate only patient's report): No Awareness, Dental Status Current problems with teeth and/or dentures?: No Does patient usually wear dentures?: No  CIWA:  CIWA-Ar Total: 0 COWS:     Musculoskeletal: Strength & Muscle Tone: within normal limits Gait & Station: normal Patient leans: N/A  Psychiatric Specialty Exam: Physical Exam  Constitutional: He is oriented to person, place, and time. He appears well-developed and well-nourished.  HENT:  Head: Normocephalic.  Eyes: EOM are normal.  Musculoskeletal: Normal range of motion.  Neurological: He is alert and oriented to person, place, and time.  Psychiatric: He has a normal mood and affect. His behavior is normal. Judgment and thought content normal.    Review of Systems  Constitutional: Negative.   HENT: Negative.    Eyes: Negative.   Respiratory: Negative.   Cardiovascular: Negative.   Gastrointestinal: Negative.   Genitourinary: Negative.   Musculoskeletal: Positive for back pain.  Skin: Negative.   Neurological: Negative.  Endo/Heme/Allergies: Negative.   Psychiatric/Behavioral: Positive for depression and suicidal ideas. The patient is nervous/anxious and has insomnia.     Blood pressure 133/61, pulse 72, temperature 98.1 F (36.7 C), temperature source Oral, resp. rate 18, height 5' 11.5" (1.816 m), weight 125.2 kg (276 lb), SpO2 98 %.Body mass index is 37.96 kg/m.  General Appearance: Fairly Groomed  Eye Contact:  Good  Speech:  Clear and Coherent and Normal Rate  Volume:  Normal  Mood:  Depressed  Affect:  Appropriate and Depressed  Thought Process:  Linear  Orientation:  Full (Time, Place, and Person)  Thought Content:  Logical  Suicidal Thoughts:  Yes.  with intent/plan  Homicidal Thoughts:  No  Memory:  Immediate;   Good Recent;   Good Remote;   Good  Judgement:  Intact  Insight:  Good  Psychomotor Activity:  Normal  Concentration:  Concentration: Good and Attention Span: Good  Recall:  Good  Fund of Knowledge:  Good  Language:  Good  Akathisia:  No  Handed:    AIMS (if indicated):     Assets:  Communication Skills Desire for Improvement Physical Health Resilience  ADL's:  Intact  Cognition:  WNL  Sleep:  Number of Hours: 6     Treatment Plan Summary: Daily contact with patient to assess and evaluate symptoms and progress in treatment   Minimal improvement since admission already for discharge. Still voicing suicidality.  Patient is still suicidal. No improvement since admission. Was unable to sleep well last night. Is still hopeless and helpless  66 y/o married Caucasian male who presents with new onset of depression of severe intensity and suicidal ideation---triggered by severe burn out from work  MDD severe single episode: Patient has been started on  fluoxetine 20 mg po q day--- still suicidal  Insomnia: Will increase trazodone to trazodone 150 mg qhs, Klonopin 0.5 mg prn  HTN: continue norvasc 10 mg po q day, cozaar 100 mg/d and HCTZ 25 mg po qday  GERD: continue pepcid 20 mg qhs and protonix 40 mg q day  BPH: continue flomax 0.4 mg po q day  Diabetes: Started metformin 500 mg BID  Diet: 2 grams Na and carb modified  VS q day  Precautions q 3973m  Dispo: home once pt more stable  F/u: will need to set up f/u with psychiatrist  Labs: b12 WNL, tsh WNL, lipids (high TGs and VLDL, low HDL) and HbA1c elevated (6.9%)  Jimmy FootmanHernandez-Gonzalez,  Maaliyah Adolph, MD 01/01/2017, 11:09 AM

## 2017-01-01 NOTE — Tx Team (Signed)
Interdisciplinary Treatment and Diagnostic Plan Update  01/01/2017 Time of Session: 11:00am Joel Owens MRN: [id number]  Principal Diagnosis: Severe major depression, single episode, without psychotic features [facility])  Secondary Diagnoses: Principal Problem:   Severe major depression, single episode, without psychotic features (HCC) Active Problems:   Herniated cervical intervertebral disc   Hypertension   GERD (gastroesophageal reflux disease)   Back pain   BPH (benign prostatic hyperplasia)   Primary osteoarthritis of left knee   Type 2 diabetes mellitus without complication, without long-term current use of insulin (HCC)   Current Medications:  Current Facility-Administered Medications  Medication Dose Route Frequency Provider Last Rate Last Dose  . acetaminophen (TYLENOL) tablet 650 mg  650 mg Oral Q6H PRN Audery Amel, MD      . alum & mag hydroxide-simeth (MAALOX/MYLANTA) 200-200-20 MG/5ML suspension 30 mL  30 mL Oral Q4H PRN Audery Amel, MD      . amLODipine (NORVASC) tablet 10 mg  10 mg Oral Daily Jimmy Footman, MD   10 mg at 01/01/17 0912  . clonazePAM (KLONOPIN) tablet 0.5 mg  0.5 mg Oral BID PRN Jimmy Footman, MD      . famotidine (PEPCID) tablet 20 mg  20 mg Oral QHS Audery Amel, MD   20 mg at 12/31/16 2205  . FLUoxetine (PROZAC) capsule 20 mg  20 mg Oral Daily Audery Amel, MD   20 mg at 01/01/17 0912  . hydrochlorothiazide (HYDRODIURIL) tablet 25 mg  25 mg Oral Daily Audery Amel, MD   25 mg at 01/01/17 0911  . losartan (COZAAR) tablet 100 mg  100 mg Oral Daily Audery Amel, MD   100 mg at 01/01/17 0912  . magnesium hydroxide (MILK OF MAGNESIA) suspension 30 mL  30 mL Oral Daily PRN Audery Amel, MD      . metFORMIN (GLUCOPHAGE) tablet 500 mg  500 mg Oral BID WC Jimmy Footman, MD   500 mg at 01/01/17 0911  . pantoprazole (PROTONIX) EC tablet 40 mg  40 mg Oral Daily Audery Amel, MD   40 mg at 01/01/17 0912  .  tamsulosin (FLOMAX) capsule 0.4 mg  0.4 mg Oral Daily Audery Amel, MD   0.4 mg at 01/01/17 0912  . traZODone (DESYREL) tablet 150 mg  150 mg Oral QHS Jimmy Footman, MD       PTA Medications: Prescriptions Prior to Admission  Medication Sig Dispense Refill Last Dose  . esomeprazole (NEXIUM) 40 MG capsule Take 40 mg by mouth daily at 12 noon.   12/29/2016 at Unknown time  . hydrochlorothiazide (HYDRODIURIL) 25 MG tablet Take 25 mg by mouth daily.   12/28/2016 at Unknown time  . Multiple Vitamins-Minerals (CENTRUM SILVER ADULT 50+ PO) Take 1 tablet by mouth daily.   12/28/2016 at Unknown time  . ranitidine (ZANTAC) 300 MG tablet Take 300 mg by mouth at bedtime.   12/28/2016 at Unknown time  . tamsulosin (FLOMAX) 0.4 MG CAPS capsule Take 0.4 mg by mouth daily.   12/28/2016 at Unknown time  . triamcinolone cream (KENALOG) 0.1 % Apply 1 application topically 2 (two) times daily as needed (skin irritation).   Past Month at Unknown time  . amLODipine (NORVASC) 10 MG tablet Take 10 mg by mouth daily.   Not Taking at Unknown time  . Cholecalciferol (VITAMIN D3) 5000 UNITS CAPS Take 5,000 Units by mouth daily.   Not Taking at Unknown time  . losartan (COZAAR) 100 MG tablet Take 100  mg by mouth daily.    Not Taking at Unknown time  . methocarbamol (ROBAXIN) 500 MG tablet Take 1 tablet (500 mg total) by mouth every 6 (six) hours as needed for muscle spasms. (Patient not taking: Reported on 12/29/2016) 60 tablet 1 Not Taking at Unknown time  . oxyCODONE-acetaminophen (PERCOCET/ROXICET) 5-325 MG tablet Take 1-2 tablets by mouth every 4 (four) hours as needed for moderate pain. (Patient not taking: Reported on 12/29/2016) 60 tablet 0 Not Taking at Unknown time  . traMADol (ULTRAM) 50 MG tablet Take 50-100 mg by mouth every 4 (four) hours as needed (pain).   Not Taking at Unknown time  . vitamin C (ASCORBIC ACID) 500 MG tablet Take 500 mg by mouth daily.   Not Taking at Unknown time    Patient Stressors:  Health problems Marital or family conflict Occupational concerns  Patient Strengths: Ability for insight Average or above average intelligence General fund of knowledge Motivation for treatment/growth Supportive family/friends Work skills  Treatment Modalities: Medication Management, Group therapy, Case management,  1 to 1 session with clinician, Psychoeducation, Recreational therapy.   Physician Treatment Plan for Primary Diagnosis: Severe major depression, single episode, without psychotic features (HCC) Long Term Goal(s): Improvement in symptoms so as ready for discharge Improvement in symptoms so as ready for discharge   Short Term Goals: Ability to verbalize feelings will improve Ability to disclose and discuss suicidal ideas Ability to identify changes in lifestyle to reduce recurrence of condition will improve Ability to identify and develop effective coping behaviors will improve  Medication Management: Evaluate patient's response, side effects, and tolerance of medication regimen.  Therapeutic Interventions: 1 to 1 sessions, Unit Group sessions and Medication administration.  Evaluation of Outcomes: Progressing  Physician Treatment Plan for Secondary Diagnosis: Principal Problem:   Severe major depression, single episode, without psychotic features (HCC) Active Problems:   Herniated cervical intervertebral disc   Hypertension   GERD (gastroesophageal reflux disease)   Back pain   BPH (benign prostatic hyperplasia)   Primary osteoarthritis of left knee   Type 2 diabetes mellitus without complication, without long-term current use of insulin (HCC)  Long Term Goal(s): Improvement in symptoms so as ready for discharge Improvement in symptoms so as ready for discharge   Short Term Goals: Ability to verbalize feelings will improve Ability to disclose and discuss suicidal ideas Ability to identify changes in lifestyle to reduce recurrence of condition will  improve Ability to identify and develop effective coping behaviors will improve     Medication Management: Evaluate patient's response, side effects, and tolerance of medication regimen.  Therapeutic Interventions: 1 to 1 sessions, Unit Group sessions and Medication administration.  Evaluation of Outcomes: Progressing   RN Treatment Plan for Primary Diagnosis: Severe major depression, single episode, without psychotic features (HCC) Long Term Goal(s): Knowledge of disease and therapeutic regimen to maintain health will improve  Short Term Goals: Ability to verbalize feelings will improve, Ability to disclose and discuss suicidal ideas, Ability to identify and develop effective coping behaviors will improve and Compliance with prescribed medications will improve  Medication Management: RN will administer medications as ordered by provider, will assess and evaluate patient's response and provide education to patient for prescribed medication. RN will report any adverse and/or side effects to prescribing provider.  Therapeutic Interventions: 1 on 1 counseling sessions, Psychoeducation, Medication administration, Evaluate responses to treatment, Monitor vital signs and CBGs as ordered, Perform/monitor CIWA, COWS, AIMS and Fall Risk screenings as ordered, Perform wound care treatments as ordered.  Evaluation of Outcomes: Progressing   LCSW Treatment Plan for Primary Diagnosis: Severe major depression, single episode, without psychotic features (HCC) Long Term Goal(s): Safe transition to appropriate next level of care at discharge, Engage patient in therapeutic group addressing interpersonal concerns.  Short Term Goals: Engage patient in aftercare planning with referrals and resources, Increase social support, Increase ability to appropriately verbalize feelings and Increase skills for wellness and recovery  Therapeutic Interventions: Assess for all discharge needs, 1 to 1 time with Social  worker, Explore available resources and support systems, Assess for adequacy in community support network, Educate family and significant other(s) on suicide prevention, Complete Psychosocial Assessment, Interpersonal group therapy.  Evaluation of Outcomes: Progressing    Recreational Therapy Treatment Plan for Primary Diagnosis: Severe major depression, single episode, without psychotic features (HCC) Long Term Goal(s): Patient will participate in recreation therapy treatment in at least 2 group sessions without prompting from LRT  Short Term Goals: Increase self-esteem, Increase stress management skills  Treatment Modalities: Group Therapy and Individual Treatment Sessions  Therapeutic Interventions: Psychoeducation  Evaluation of Outcomes: Progressing   Progress in Treatment: Attending groups: No. Participating in groups: No. Taking medication as prescribed: Yes. Toleration medication: Yes. Family/Significant other contact made: No, will contact:  Patient declined for family contact to be made.  Patient understands diagnosis: Yes. Discussing patient identified problems/goals with staff: Yes. Medical problems stabilized or resolved: Yes. Denies suicidal/homicidal ideation: No. Issues/concerns per patient self-inventory: No. Other: n/a  New problem(s) identified: None identified at this time.   New Short Term/Long Term Goal(s): " I need help finding hope and being positive".   Discharge Plan or Barriers: Patient will discharge home with outpatient services for medication management and outpatient therapy.   Reason for Continuation of Hospitalization: Depression Medication stabilization  Estimated Length of Stay: 5 to 7 days.   Attendees: Patient: Joel Owens 01/01/2017 2:14 PM  Physician: Dr. Radene JourneyAndrea HernandezJayme Cloud- Gonzalez, MD 01/01/2017 2:14 PM  Nursing: Leonia ReaderPhyllis Cobb, RN 01/01/2017 2:14 PM  RN Care Manager: 01/01/2017 2:14 PM  Social Worker: Fredrich BirksAmaris G. Garnette CzechSampson MSW, LCSWA  01/01/2017 2:14 PM  Recreational Therapist: Jacquelynn CreeElizabeth M. Caroline Longie, LRT/CTRS 01/01/2017 2:14 PM  Other:  01/01/2017 2:14 PM  Other:  01/01/2017 2:14 PM  Other: 01/01/2017 2:14 PM    Scribe for Treatment Team: Arelia LongestAmaris G Sampson, LCSWA 01/01/2017 2:17 PM

## 2017-01-01 NOTE — Progress Notes (Signed)
Affect much brighter today.  More visible in the milieu.   Interacting with peers and staff.  Jokes at times. Denies SI/HI/AVH.  Attended 1 group today. Maintaining personal care chores.   Good appetite.  Support and encouragement offered.  Safety checks maintained.

## 2017-01-01 NOTE — Progress Notes (Signed)
Recreation Therapy Notes  Date: 03.16.18 Time: 1:00 pm Location: Craft Room  Group Topic: Social Skills  Goal Area(s) Addresses:  Patient will effectively work with peer towards shared goal. Patient will identify skill used to make activity successful. Patient will identify how skills used during activity can be used to reach post d/c goals.  Behavioral Response: Attentive, Interactive  Intervention: Life Boat  Activity: Patients were given a scenario of [place] and being on a Leisure centre manageryacht exploring. While on the yacht, there was a leak and the patients were in charge of dividing people to go on a bigger, faster boat or a smaller raft. Patients were given a list on 16 people (Marden NobleJimmy Fallon, bus driver, Curatormechanic, etc.) and were instructed to chose 8 people to go with them on the bigger boat and 8 people to go on the smaller boat.  Education: LRT educated patients on healthy support systems.  Education Outcome: Acknowledges education/In group clarification offered  Clinical Observations/Feedback: Patient worked with peers towards shared goal. Patient used effective communication, Physiological scientistteamwork, and problem solving skills. Patient contributed to group discussion by stating what skills he used, why these skills are important, and what would change for him if he started using these skills post d/c.  Jacquelynn CreeGreene,Lum Stillinger M, LRT/CTRS 01/01/2017 1:59 PM

## 2017-01-02 NOTE — Progress Notes (Signed)
Pt awake, alert, oriented today. Isolates to room except during meal time due to reports of increasing depression and anxiety related to pt learning he has lost his job. Pt worried about how he is going to provide for himself and wife at home. Pt appears depressed, sad, worried. Did not attend group today. Noted to be in bed all day, but reports he has not been able to sleep at all. Pt is pleasant and cooperative. Medication compliant. Endorses passive SI without a specific plan. Denies HI/AVH. Support and encouragement provided. Medications administered as ordered with education. Safety maintained with every 15 minute checks. Will continue to monitor.

## 2017-01-02 NOTE — [facility] Group Notes (Signed)
[facility] LCSW Group Therapy  01/02/2017 2:58 PM  Type of Therapy:  Group Therapy  Participation Level:  Patient did not attend group. CSW invited patient to group.   Summary of Progress/Problems: Goal Setting: The objective is to set goals as they relate to the crisis in which they were admitted. Patients will be using SMART goal modalities to set measurable goals. Characteristics of realistic goals will be discussed and patients will be assisted in setting and processing how one will reach their goal. Facilitator will also assist patients in applying interventions and coping skills learned in psycho-education groups to the SMART goal and process how one will achieve defined goal.  Mikhail Hallenbeck G. Garnette CzechSampson MSW, LCSWA 01/02/2017, 2:59 PM

## 2017-01-02 NOTE — Plan of Care (Signed)
Problem: Coping: Goal: Ability to verbalize frustrations and anger appropriately will improve Outcome: Progressing Patient verbalized feelings to staff.    

## 2017-01-02 NOTE — Progress Notes (Signed)
[facility] MD Progress Note  01/02/2017 2:02 PM Joel Owens  MRN:  [id number] Subjective:  66 year old man Who came to our emergency department voluntarily on March 13. He was referred to the ER by his primary care doctor Dr. Zada Finders due to suicidal ideation.  "I am having trouble coping" Patient stated he has had SI for " a while" with several different plans--he would drive into a tree or take sleeping pills.   His job is the cause of his mental and emotional state. He's been working for the same [employer] for approximately 19 years. Approximately three years ago, their CEO reduced their workforce and it resulted in all his support staff been let go. He works more than 60 hours a week, because he have to do the job of five other people. He also has not taking any vacation in 6 years  The last three months have been extremely stressful for him. He is having the thoughts of running his car into a tree and overdosing on sleeping pills. The thoughts are increasing and they are now occurring "every hour on the hour." His sleep has decreased and his appetite fluctuates. When asked what's keeping him from ending his life, he stated "I don't know." He lives in the same home with his wife but their relationship is distant.    Substance abuse history: He says he drinks very little maybe 4 drinks total in a year no history of drug abuse. He is a former smoker  3/15 Patient reports he continues to feel depressed today, still having suicidal thoughts of running off the road or taking sleeping pills. Reports poor, interrupted sleep last night despite taking Trazadone 25 mg. Reports good appetite. Denies HI, auditory/visual hallucinations, side effects to meds. Attended 1 group session yesterday, feels like they dont help him. Says the bed hurts his back and his left leg also hurts due to the back pain.  3/16 Pt says he barely slept last night even though his Trazadone dose was increased to 100 mg.  He also still feels depressed and continues to have suicidal thoughts whenever he thinks about his issues at work. He feels very guilty about his coworkers having to cover for him when they are already burn out.  Patient did not attend any groups in the day yesterday. He attended one group in the evening only. He has not been able to sleep over the last 2 evenings. His participation in groups has been minimal.  Per nursing  Patient remains depressed and sad. Flat affect. Isolative and guarded. Rates depression level 6/10(0low-10 worst). States he still feeing sad. Voiced passive SI, with no plan. Remains in bed except for medications and snack. Limited interaction with peers and staff. Med compliant. No PRNs give. Med adm w/education. Safety maintained with q 15 min checks. Pt remains free from harm.   Patient refusing all medications, refused to get out of bed. States "Im not taking anymore medications or getting up" "Im tired of taking meds". Pt did not come out of room. Irritable. Tells staff different scenarios. Encouraged patient to come out of room and attend groups and verbalize feelings. Pt continues to be isolative to room. Pt remains safe on unit with q 15 min checks.  Patient seen. Follow-up for the 17th. Patient says his mood is feeling even worse than when he came in. He has received word that he was fired from his job. Patient was cooperative with talking with me but has been withdrawn and doesn't  participate in groups or other activities. Passive suicidal thoughts but no intent on acting on it in the hospital. No current psychotic symptoms. Physically no new complaints.  Principal Problem: Severe major depression, single episode, without psychotic features (HCC) Diagnosis:   Patient Active Problem List   Diagnosis Date Noted  . GERD (gastroesophageal reflux disease) [K21.9] 12/30/2016  . BPH (benign prostatic hyperplasia) [N40.0] 12/30/2016  . Hypertension [I10] 12/29/2016  . Severe  major depression, single episode, without psychotic features (HCC) [F32.2] 12/29/2016  . Type 2 diabetes mellitus without complication, without long-term current use of insulin (HCC) [E11.9] 02/25/2016  . Herniated cervical intervertebral disc [M50.20] 07/30/2015  . Primary osteoarthritis of left knee [M17.12] 06/13/2014  . Back pain [M54.9] 07/17/2013   Total Time spent with patient: 20 minutes  Past Psychiatric History: No past history of psychiatric treatment. No hospitalization. No suicide attempts. Never been on any psychiatric medicine. No history of psychotic symptoms or substance abuse.  Past Medical History:  Past Medical History:  Diagnosis Date  . GERD (gastroesophageal reflux disease)   . Heart murmur    as child   no problems  . Hypertension   . Shortness of breath dyspnea    with exertion   . Sleep apnea    never got cpap     Past Surgical History:  Procedure Laterality Date  . ANTERIOR CERVICAL DECOMP/DISCECTOMY FUSION N/A 07/30/2015   Procedure: C4-5 C5-6 C6-7 Anterior cervical decompression/diskectomy/fusion;  Surgeon: Maeola Harman, MD;  Location: [facility];  Service: Neurosurgery;  Laterality: N/A;  C4-5 C5-6 C6-7 Anterior cervical decompression/diskectomy/fusion  . BACK SURGERY     1980s  . KNEE ARTHROPLASTY     bilat   . ROTATOR CUFF REPAIR     left   . TONSILLECTOMY     Family History: History reviewed. No pertinent family history.  Family Psychiatric  History: he is adopted.  Does not know  Social History: Patient is married but he says that he and his wife have a very strained relationship. They don't sleep together and he feels like she is completely unsupportive. 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 Doesn't have anything that he enjoys.  Has a son who is 17.  Lives near by and they are close. History  Alcohol Use No     History  Drug Use No    Social History   Social History  . Marital status:  Married    Spouse name: N/A  . Number of children: N/A  . Years of education: N/A   Social History Main Topics  . Smoking status: Former Smoker    Years: 5.00  . Smokeless tobacco: Never Used  . Alcohol use No  . Drug use: No  . Sexual activity: No   Other Topics Concern  . None   Social History Narrative  . None   Additional Social History:    Patient is married but he says that he and his wife have a very strained relationship. They don't sleep together and he feels like she is completely unsupportive. 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 Doesn't have anything that he enjoys.  Has a son who is 75.  Lives near by and they are close.  No legal trouble     Sleep: Poor  Appetite:  Fair  Current Medications: Current Facility-Administered Medications  Medication Dose Route Frequency Provider Last Rate Last Dose  . acetaminophen (  TYLENOL) tablet 650 mg  650 mg Oral Q6H PRN Audery Amel, MD      . alum & mag hydroxide-simeth (MAALOX/MYLANTA) 200-200-20 MG/5ML suspension 30 mL  30 mL Oral Q4H PRN Audery Amel, MD      . amLODipine (NORVASC) tablet 10 mg  10 mg Oral Daily Jimmy Footman, MD   10 mg at 01/02/17 0859  . clonazePAM (KLONOPIN) tablet 0.5 mg  0.5 mg Oral BID PRN Jimmy Footman, MD   0.5 mg at 01/01/17 2020  . famotidine (PEPCID) tablet 20 mg  20 mg Oral QHS Audery Amel, MD   20 mg at 01/02/17 0024  . FLUoxetine (PROZAC) capsule 20 mg  20 mg Oral Daily Audery Amel, MD   20 mg at 01/02/17 0859  . hydrochlorothiazide (HYDRODIURIL) tablet 25 mg  25 mg Oral Daily Audery Amel, MD   25 mg at 01/02/17 0859  . losartan (COZAAR) tablet 100 mg  100 mg Oral Daily Audery Amel, MD   100 mg at 01/02/17 0858  . magnesium hydroxide (MILK OF MAGNESIA) suspension 30 mL  30 mL Oral Daily PRN Audery Amel, MD      . metFORMIN (GLUCOPHAGE) tablet 500 mg  500 mg Oral BID WC Jimmy Footman, MD    500 mg at 01/02/17 0859  . pantoprazole (PROTONIX) EC tablet 40 mg  40 mg Oral Daily Audery Amel, MD   40 mg at 01/02/17 0859  . tamsulosin (FLOMAX) capsule 0.4 mg  0.4 mg Oral Daily Audery Amel, MD   0.4 mg at 01/02/17 0859  . traZODone (DESYREL) tablet 150 mg  150 mg Oral QHS Jimmy Footman, MD   150 mg at 01/01/17 2300    Lab Results:  Results for orders placed or performed during the hospital encounter of 12/29/16 (from the past 48 hour(s))  Glucose, capillary     Status: Abnormal   Collection Time: 01/01/17  6:14 AM  Result Value Ref Range   Glucose-Capillary 122 (H) 65 - 99 mg/dL    Blood Alcohol level:  Lab Results  Component Value Date   ETH <5 12/29/2016    Metabolic Disorder Labs: Lab Results  Component Value Date   HGBA1C 6.9 (H) 12/30/2016   MPG 151 12/30/2016   No results found for: PROLACTIN Lab Results  Component Value Date   CHOL 160 12/30/2016   TRIG 219 (H) 12/30/2016   HDL 32 (L) 12/30/2016   CHOLHDL 5.0 12/30/2016   VLDL 44 (H) 12/30/2016   LDLCALC 84 12/30/2016    Physical Findings: AIMS: Facial and Oral Movements Muscles of Facial Expression: None, normal Lips and Perioral Area: None, normal Jaw: None, normal Tongue: None, normal,Extremity Movements Upper (arms, wrists, hands, fingers): None, normal Lower (legs, knees, ankles, toes): None, normal, Trunk Movements Neck, shoulders, hips: None, normal, Overall Severity Severity of abnormal movements (highest score from questions above): None, normal Incapacitation due to abnormal movements: None, normal Patient's awareness of abnormal movements (rate only patient's report): No Awareness, Dental Status Current problems with teeth and/or dentures?: No Does patient usually wear dentures?: No  CIWA:  CIWA-Ar Total: 0 COWS:     Musculoskeletal: Strength & Muscle Tone: within normal limits Gait & Station: normal Patient leans: N/A  Psychiatric Specialty Exam: Physical Exam   Constitutional: He is oriented to person, place, and time. He appears well-developed and well-nourished.  HENT:  Head: Normocephalic.  Eyes: EOM are normal.  Musculoskeletal: Normal range of motion.  Neurological: He is alert and oriented to person, place, and time.  Psychiatric: Judgment normal. His mood appears anxious. He is slowed and withdrawn. He exhibits a depressed mood. He expresses suicidal ideation.    Review of Systems  Constitutional: Negative.   HENT: Negative.   Eyes: Negative.   Respiratory: Negative.   Cardiovascular: Negative.   Gastrointestinal: Negative.   Genitourinary: Negative.   Musculoskeletal: Positive for back pain.  Skin: Negative.   Neurological: Negative.   Endo/Heme/Allergies: Negative.   Psychiatric/Behavioral: Positive for depression and suicidal ideas. The patient is nervous/anxious and has insomnia.     Blood pressure (!) 142/74, pulse 74, temperature 97.8 F (36.6 C), temperature source Oral, resp. rate 18, height 5' 11.5" (1.816 m), weight 125.2 kg (276 lb), SpO2 98 %.Body mass index is 37.96 kg/m.  General Appearance: Fairly Groomed  Eye Contact:  Good  Speech:  Clear and Coherent and Normal Rate  Volume:  Normal  Mood:  Depressed  Affect:  Appropriate and Depressed  Thought Process:  Linear  Orientation:  Full (Time, Place, and Person)  Thought Content:  Logical  Suicidal Thoughts:  Yes.  with intent/plan  Homicidal Thoughts:  No  Memory:  Immediate;   Good Recent;   Good Remote;   Good  Judgement:  Intact  Insight:  Good  Psychomotor Activity:  Normal  Concentration:  Concentration: Good and Attention Span: Good  Recall:  Good  Fund of Knowledge:  Good  Language:  Good  Akathisia:  No  Handed:    AIMS (if indicated):     Assets:  Communication Skills Desire for Improvement Physical Health Resilience  ADL's:  Intact  Cognition:  WNL  Sleep:  Number of Hours: 6     Treatment Plan Summary: Daily contact with patient to  assess and evaluate symptoms and progress in treatment   Minimal improvement since admission already for discharge. Still voicing suicidality.  Patient is still suicidal. No improvement since admission. Was unable to sleep well last night. Is still hopeless and helpless  66 y/o married Caucasian male who presents with new onset of depression of severe intensity and suicidal ideation---triggered by severe burn out from work  MDD severe single episode: Patient has been started on fluoxetine 20 mg po q day--- still suicidal  Insomnia: Will increase trazodone to trazodone 150 mg qhs, Klonopin 0.5 mg prn  HTN: continue norvasc 10 mg po q day, cozaar 100 mg/d and HCTZ 25 mg po qday  GERD: continue pepcid 20 mg qhs and protonix 40 mg q day  BPH: continue flomax 0.4 mg po q day  Diabetes: Started metformin 500 mg BID  Diet: 2 grams Na and carb modified  VS q day  Precautions q 3411m  Dispo: home once pt more stable  F/u: will need to set up f/u with psychiatrist  Labs: b12 WNL, tsh WNL, lipids (high TGs and VLDL, low HDL) and HbA1c elevated (6.9%)  Patient is cooperating with sleeping medicine and his psychiatric medicine now. Mood is still very down and depressed if anything worse. Spent time doing supportive therapy. Encouraged him to talk with social work and attend groups. Follow-up tomorrow. Mordecai RasmussenJohn Clapacs, MD 01/02/2017, 2:02 PM

## 2017-01-02 NOTE — Plan of Care (Signed)
Problem: Coping: Goal: Ability to cope will improve Outcome: Not Progressing Pt noted to regress with increased anxiety and depression related to learning that he has possibly been fired from his job of 19 years. Isolated to room all day. Support and encouragement provided.

## 2017-01-02 NOTE — Progress Notes (Signed)
D: Pt denies SI/HI/AVH. Pt is pleasant and cooperative, affect is flat and sad. 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  A: Pt was offered support and encouragement. Pt was given scheduled medications. Pt was encouraged to attend groups. Q 15 minute checks were done for safety.  R:Pt attends groups and interacts well with peers and staff. Pt is taking medication. Pt has no complaints.Pt receptive to treatment and safety maintained on unit.

## 2017-01-03 NOTE — Plan of Care (Signed)
Problem: Coping: Goal: Ability to cope will improve Outcome: Progressing Able to express feelings and concerns appropriately, interacting appropriately with staff and peers.

## 2017-01-03 NOTE — [facility] Group Notes (Signed)
[facility] LCSW Group Therapy  01/03/2017 3:12 PM  Type of Therapy:  Group Therapy  Participation Level:  Active  Participation Quality:  Appropriate and Sharing  Affect:  Appropriate  Cognitive:  Alert  Insight:  Developing/Improving  Engagement in Therapy:  Developing/Improving  Modes of Intervention:  Activity, Discussion, Education, Problem-solving, Reality Testing, Socialization and Support  Summary of Progress/Problems: Stress management: Patients defined and discussed the topic of stress and the related symptoms and triggers for stress. Patients identified healthy coping skills they would like to try during hospitalization and after discharge to manage stress in a healthy way. CSW offered insight to varying stress management techniques.   Sanuel Ladnier G. Garnette CzechSampson MSW, LCSWA 01/03/2017, 3:12 PM

## 2017-01-03 NOTE — Progress Notes (Signed)
Received AAOx4 this morning, he is calm, pleasant, and appropriate. He was compliant with medications and interacted appropriately with peers and staff. Denies SI.HI.AVH and contracts for safety. Appears in good mood and has no complaints this morning. Will continue to monitor and encourage verbalization of thoughts and feelings.

## 2017-01-03 NOTE — [facility] Group Notes (Signed)
[facility] Group Notes:  (Nursing/MHT/Case Management/Adjunct)  Date:  01/03/2017  Time:  12:43 AM  Type of Therapy:  Group Therapy  Participation Level:  Active  Participation Quality:  Appropriate  Affect:  Appropriate  Cognitive:  Appropriate  Insight:  Good and Improving  Engagement in Group:  Developing/Improving and Engaged  Modes of Intervention:  Discussion  Summary of Progress/Problems: Pt stated that he was feeling a lot better and his goal is to get discharged. Pt feels like he has resolved a lot of his feelings of sadness. That his larger concern is trying to find a way to get his job back.   Fanny Skatesshley Imani Laekyn Rayos 01/03/2017, 12:43 AM

## 2017-01-03 NOTE — Progress Notes (Signed)
D: Pt denies SI/HI/AVH. Pt is pleasant and cooperative, affect is flat. Patient expressed some sadness over the loss of his job, he appeared depressed and in despair.  Pt stated his main focus was to work on his goal while here and hopefully feels better from doing ECT, he appears less anxious and he is interacting with peers and staff appropriately.  A: Pt was offered support and encouragement. Pt was given scheduled medications. Pt was encouraged to attend groups. Q 15 minute checks were done for safety.  R:Pt attends groups and interacts well with peers and staff. Pt is taking medication. Pt has no complaints.Pt receptive to treatment and safety maintained on unit.

## 2017-01-04 LAB — GLUCOSE, CAPILLARY: GLUCOSE-CAPILLARY: 114 mg/dL — AB (ref 65–99)

## 2017-01-04 MED ORDER — FLUOXETINE HCL 20 MG PO CAPS: 20.0000 mg | ORAL_CAPSULE | Freq: Every day | ORAL | 0 refills | Status: DC

## 2017-01-04 MED ORDER — TRAZODONE HCL 150 MG PO TABS: 150.0000 mg | ORAL_TABLET | Freq: Every day | ORAL | 0 refills | Status: DC

## 2017-01-04 MED ORDER — METFORMIN HCL 500 MG PO TABS: 500.0000 mg | ORAL_TABLET | Freq: Two times a day (BID) | ORAL | 0 refills | Status: DC

## 2017-01-04 NOTE — Progress Notes (Signed)
Recreation Therapy Notes  Date: 03.19.18 Time: 1:00 pm Location: Craft Room  Group Topic: Wellness  Goal Area(s) Addresses:  Patient will identify at least one item per dimension of health. Patient will examine areas they are deficient in.  Behavioral Response: Attentive, Interactive  Intervention: 6 Dimensions of Health  Activity: Patients were given a definition sheet of each dimension of wellness and a worksheet with each dimension listed. Patients were instructed to write things they were currently doing in each dimension.  Education: LRT educated patient on ways to improve each dimension.  Education Outcome: Acknowledges education/In group clarification offered  Clinical Observations/Feedback: Patient wrote things in all but one dimension. Patient contributed to group discussion by stating what area he was giving enough attention to, what area he was not giving enough attention to, how he can improve certain dimensions, and what would change for him if he was more aware of his wellness.  Jacquelynn CreeGreene,Alfa Leibensperger M, LRT/CTRS 01/04/2017 2:23 PM

## 2017-01-04 NOTE — Progress Notes (Addendum)
January 04, 2017   Patient:  Joel Owens is a 66 y.o., male MRN:  [id number] DOB:  08/05/1951 Patient phone:  [phone] (home)          Patient address:   [address] [place] [address],   To whom it may concern:  This patient was hospitalized at [facility] from 12/29/16 to 01/04/17. This patient has not been medically cleared.  He will need to follow up with his outpatient physician for medical clearance.   If more information is needed about this case, please do not hesitate to contact me at [phone].    Radene JourneyAndrea Hernandez MD [facility] [address], [place], [place] [address]

## 2017-01-04 NOTE — Progress Notes (Signed)
Patient discharged home. DC instructions provided and explained. Medications reviewed. Rx given. All questions answered. Denies SI, HI, AVH. Belongings returned. AVS, transition and discharge risk assessment given to patient. Pt stable at discharge.

## 2017-01-04 NOTE — Progress Notes (Signed)
Recreation Therapy Notes  INPATIENT RECREATION TR PLAN  Patient Details Name: ABDALRAHMAN CLEMENTSON MRN: [id number] DOB: 06-Nov-1950 Today's Date: 01/04/2017  Rec Therapy Plan Is patient appropriate for Therapeutic Recreation?: Yes Treatment times per week: At least once a week TR Treatment/Interventions: 1:1 session, Group participation (Comment) (Appropriate participation in daily recreational therapy tx)  Discharge Criteria Pt will be discharged from therapy if:: Treatment goals are met, Discharged Treatment plan/goals/alternatives discussed and agreed upon by:: Patient/family  Discharge Summary Short term goals set: See Care Plan Short term goals met: Complete Progress toward goals comments: One-to-one attended Which groups?: Wellness, Social skills One-to-one attended: Self-esteem, stress management Reason goals not met: N/A Therapeutic equipment acquired: None Reason patient discharged from therapy: Discharge from hospital Pt/family agrees with progress & goals achieved: Yes Date patient discharged from therapy: 01/04/17   Leonette Monarch, LRT/CTRS 01/04/2017, 4:09 PM

## 2017-01-04 NOTE — Tx Team (Signed)
Interdisciplinary Treatment and Diagnostic Plan Update  01/04/2017 Time of Session: 11:00am Joel Owens MRN: [id number]  Principal Diagnosis: Severe major depression, single episode, without psychotic features [facility])  Secondary Diagnoses: Principal Problem:   Severe major depression, single episode, without psychotic features ([place]) Active Problems:   Herniated cervical intervertebral disc   Hypertension   GERD (gastroesophageal reflux disease)   Back pain   BPH (benign prostatic hyperplasia)   Primary osteoarthritis of left knee   Type 2 diabetes mellitus without complication, without long-term current use of insulin (HCC)   Current Medications:  Current Facility-Administered Medications  Medication Dose Route Frequency Provider Last Rate Last Dose  . acetaminophen (TYLENOL) tablet 650 mg  650 mg Oral Q6H PRN Gonzella Lex, MD      . alum & mag hydroxide-simeth (MAALOX/MYLANTA) 200-200-20 MG/5ML suspension 30 mL  30 mL Oral Q4H PRN Gonzella Lex, MD      . amLODipine (NORVASC) tablet 10 mg  10 mg Oral Daily Hildred Priest, MD   10 mg at 01/04/17 0906  . clonazePAM (KLONOPIN) tablet 0.5 mg  0.5 mg Oral BID PRN Hildred Priest, MD   0.5 mg at 01/01/17 2020  . famotidine (PEPCID) tablet 20 mg  20 mg Oral QHS Gonzella Lex, MD   20 mg at 01/03/17 2220  . FLUoxetine (PROZAC) capsule 20 mg  20 mg Oral Daily Gonzella Lex, MD   20 mg at 01/04/17 0906  . hydrochlorothiazide (HYDRODIURIL) tablet 25 mg  25 mg Oral Daily Gonzella Lex, MD   25 mg at 01/04/17 0907  . losartan (COZAAR) tablet 100 mg  100 mg Oral Daily Gonzella Lex, MD   100 mg at 01/04/17 0907  . magnesium hydroxide (MILK OF MAGNESIA) suspension 30 mL  30 mL Oral Daily PRN Gonzella Lex, MD      . metFORMIN (GLUCOPHAGE) tablet 500 mg  500 mg Oral BID WC Hildred Priest, MD   500 mg at 01/04/17 0906  . pantoprazole (PROTONIX) EC tablet 40 mg  40 mg Oral Daily Gonzella Lex, MD   40 mg  at 01/04/17 0907  . tamsulosin (FLOMAX) capsule 0.4 mg  0.4 mg Oral Daily Gonzella Lex, MD   0.4 mg at 01/04/17 0906  . traZODone (DESYREL) tablet 150 mg  150 mg Oral QHS Hildred Priest, MD   150 mg at 01/03/17 2220   PTA Medications: Prescriptions Prior to Admission  Medication Sig Dispense Refill Last Dose  . esomeprazole (NEXIUM) 40 MG capsule Take 40 mg by mouth daily at 12 noon.   12/29/2016 at Unknown time  . hydrochlorothiazide (HYDRODIURIL) 25 MG tablet Take 25 mg by mouth daily.   12/28/2016 at Unknown time  . Multiple Vitamins-Minerals (CENTRUM SILVER ADULT 50+ PO) Take 1 tablet by mouth daily.   12/28/2016 at Unknown time  . ranitidine (ZANTAC) 300 MG tablet Take 300 mg by mouth at bedtime.   12/28/2016 at Unknown time  . tamsulosin (FLOMAX) 0.4 MG CAPS capsule Take 0.4 mg by mouth daily.   12/28/2016 at Unknown time  . triamcinolone cream (KENALOG) 0.1 % Apply 1 application topically 2 (two) times daily as needed (skin irritation).   Past Month at Unknown time  . amLODipine (NORVASC) 10 MG tablet Take 10 mg by mouth daily.   Not Taking at Unknown time  . Cholecalciferol (VITAMIN D3) 5000 UNITS CAPS Take 5,000 Units by mouth daily.   Not Taking at Unknown time  . losartan (  COZAAR) 100 MG tablet Take 100 mg by mouth daily.    Not Taking at Unknown time  . methocarbamol (ROBAXIN) 500 MG tablet Take 1 tablet (500 mg total) by mouth every 6 (six) hours as needed for muscle spasms. (Patient not taking: Reported on 12/29/2016) 60 tablet 1 Not Taking at Unknown time  . oxyCODONE-acetaminophen (PERCOCET/ROXICET) 5-325 MG tablet Take 1-2 tablets by mouth every 4 (four) hours as needed for moderate pain. (Patient not taking: Reported on 12/29/2016) 60 tablet 0 Not Taking at Unknown time  . traMADol (ULTRAM) 50 MG tablet Take 50-100 mg by mouth every 4 (four) hours as needed (pain).   Not Taking at Unknown time  . vitamin C (ASCORBIC ACID) 500 MG tablet Take 500 mg by mouth daily.   Not  Taking at Unknown time    Patient Stressors: Health problems Marital or family conflict Occupational concerns  Patient Strengths: Ability for insight Average or above average intelligence General fund of knowledge Motivation for treatment/growth Supportive family/friends Work skills  Treatment Modalities: Medication Management, Group therapy, Case management,  1 to 1 session with clinician, Psychoeducation, Recreational therapy.   Physician Treatment Plan for Primary Diagnosis: Severe major depression, single episode, without psychotic features ([name]) Long Term Goal(s): Improvement in symptoms so as ready for discharge Improvement in symptoms so as ready for discharge   Short Term Goals: Ability to verbalize feelings will improve Ability to disclose and discuss suicidal ideas Ability to identify changes in lifestyle to reduce recurrence of condition will improve Ability to identify and develop effective coping behaviors will improve  Medication Management: Evaluate patient's response, side effects, and tolerance of medication regimen.  Therapeutic Interventions: 1 to 1 sessions, Unit Group sessions and Medication administration.  Evaluation of Outcomes: Adequate for Discharge  Physician Treatment Plan for Secondary Diagnosis: Principal Problem:   Severe major depression, single episode, without psychotic features ([name]) Active Problems:   Herniated cervical intervertebral disc   Hypertension   GERD (gastroesophageal reflux disease)   Back pain   BPH (benign prostatic hyperplasia)   Primary osteoarthritis of left knee   Type 2 diabetes mellitus without complication, without long-term current use of insulin (HCC)  Long Term Goal(s): Improvement in symptoms so as ready for discharge Improvement in symptoms so as ready for discharge   Short Term Goals: Ability to verbalize feelings will improve Ability to disclose and discuss suicidal ideas Ability to identify changes in  lifestyle to reduce recurrence of condition will improve Ability to identify and develop effective coping behaviors will improve     Medication Management: Evaluate patient's response, side effects, and tolerance of medication regimen.  Therapeutic Interventions: 1 to 1 sessions, Unit Group sessions and Medication administration.  Evaluation of Outcomes: Adequate for Discharge   RN Treatment Plan for Primary Diagnosis: Severe major depression, single episode, without psychotic features ([place]) Long Term Goal(s): Knowledge of disease and therapeutic regimen to maintain health will improve  Short Term Goals: Ability to verbalize feelings will improve, Ability to disclose and discuss suicidal ideas, Ability to identify and develop effective coping behaviors will improve and Compliance with prescribed medications will improve  Medication Management: RN will administer medications as ordered by provider, will assess and evaluate patient's response and provide education to patient for prescribed medication. RN will report any adverse and/or side effects to prescribing provider.  Therapeutic Interventions: 1 on 1 counseling sessions, Psychoeducation, Medication administration, Evaluate responses to treatment, Monitor vital signs and CBGs as ordered, Perform/monitor CIWA, COWS, AIMS and Fall  Risk screenings as ordered, Perform wound care treatments as ordered.  Evaluation of Outcomes: Adequate for Discharge   LCSW Treatment Plan for Primary Diagnosis: Severe major depression, single episode, without psychotic features ([place]) Long Term Goal(s): Safe transition to appropriate next level of care at discharge, Engage patient in therapeutic group addressing interpersonal concerns.  Short Term Goals: Engage patient in aftercare planning with referrals and resources, Increase social support, Increase ability to appropriately verbalize feelings and Increase skills for wellness and recovery  Therapeutic  Interventions: Assess for all discharge needs, 1 to 1 time with Social worker, Explore available resources and support systems, Assess for adequacy in community support network, Educate family and significant other(s) on suicide prevention, Complete Psychosocial Assessment, Interpersonal group therapy.  Evaluation of Outcomes: Adequate for Discharge    Recreational Therapy Treatment Plan for Primary Diagnosis: Severe major depression, single episode, without psychotic features ([place]) Long Term Goal(s): Patient will participate in recreation therapy treatment in at least 2 group sessions without prompting from LRT  Short Term Goals: Increase self-esteem, Increase stress management skills  Treatment Modalities: Group Therapy and Individual Treatment Sessions  Therapeutic Interventions: Psychoeducation  Evaluation of Outcomes: Met   Progress in Treatment: Attending groups: Yes Participating in groups: Yes Taking medication as prescribed: Yes. Toleration medication: Yes. Family/Significant other contact made: No, will contact:  Patient declined for family contact to be made.  Patient understands diagnosis: Yes. Discussing patient identified problems/goals with staff: Yes. Medical problems stabilized or resolved: Yes. Denies suicidal/homicidal ideation: Yes Issues/concerns per patient self-inventory: No. Other: n/a  New problem(s) identified: None identified at this time.   New Short Term/Long Term Goal(s): " I need help finding hope and being positive".   Discharge Plan or Barriers: Patient will discharge home with outpatient services for medication management and outpatient therapy.   Reason for Continuation of Hospitalization: none  Estimated Length of Stay: Pt discharging today.  Attendees: Patient:    Physician: Dr. Hildred Priest, MD 01/04/2017   Nursing: Floyde Parkins, RN 01/04/2017   RN Care Manager: 01/04/2017   Social Worker: Lurline Idol MSW, LCSW 01/04/2017    Recreational Therapist: Leonette Monarch, LRT/CTRS 01/04/2017  Other:  01/04/2017   Other:  01/04/2017   Other:     Scribe for Treatment Team: Joanne Chars, [place] 01/04/2017 3:15 PM

## 2017-01-04 NOTE — Progress Notes (Signed)
  [facility] :  Will you be returning to the same living situation after discharge:  Yes,  own home At discharge, do you have transportation home?: Yes,  own car Do you have the ability to pay for your medications: Yes,  BC[employer]  Release of information consent forms completed and in the chart;  Patient's signature needed at discharge.  Patient to Follow up at: Follow-up Information    [facility] Go on 01/06/2017.   Why:  [facility] at 12:30pm.  Please bring photo ID and a copy of your hospital discharge paperwork. Contact information: [address] [place] [id number] [phone]           Next level of care provider [facility] Link:no  Safety Planning and Suicide Prevention discussed: No. Pt refused.  Have you used any form of tobacco in the last 30 days? (Cigarettes, Smokeless Tobacco, Cigars, and/or Pipes): No  Has patient been referred to the Quitline?: N/A patient is not a smoker  Patient has been referred for addiction treatment: N/A  Lorri FrederickWierda, Dorien Bessent Jon, LCSW 01/04/2017, 3:17 PM

## 2017-01-04 NOTE — [facility] Suicide Risk Assessment (Signed)
[facility]   Principal Problem: Severe major depression, single episode, without psychotic features [facility](HCC) Discharge Diagnoses:  Patient Active Problem List   Diagnosis Date Noted  . GERD (gastroesophageal reflux disease) [K21.9] 12/30/2016  . BPH (benign prostatic hyperplasia) [N40.0] 12/30/2016  . Hypertension [I10] 12/29/2016  . Severe major depression, single episode, without psychotic features (HCC) [F32.2] 12/29/2016  . Type 2 diabetes mellitus without complication, without long-term current use of insulin (HCC) [E11.9] 02/25/2016  . Herniated cervical intervertebral disc [M50.20] 07/30/2015  . Primary osteoarthritis of left knee [M17.12] 06/13/2014  . Back pain [M54.9] 07/17/2013    Psychiatric Specialty Exam: ROS  Blood pressure (!) 142/68, pulse 75, temperature 98.2 F (36.8 C), resp. rate 18, height 5' 11.5" (1.816 m), weight 125.2 kg (276 lb), SpO2 98 %.Body mass index is 37.96 kg/m.                                                       Mental Status Per Nursing Assessment::   On Admission:     Demographic Factors:  Male and Caucasian  Loss Factors: laboral stressors  Historical Factors: NA  Risk Reduction Factors:   Sense of responsibility to family, Religious beliefs about death, Employed, Living with another person, especially a relative and Positive social support  No access to guns  Continued Clinical Symptoms:  MDD (no longer hopeless or suicidal), insomnia resolved with trazodone  Cognitive Features That Contribute To Risk:  None    Suicide Risk:  Minimal: No identifiable suicidal ideation.  Patients presenting with no risk factors but with morbid ruminations; may be classified as minimal risk based on the severity of the depressive symptoms     Joel FootmanHernandez-Gonzalez,  [name], MD 01/04/2017, 9:40 AM

## 2017-01-04 NOTE — Plan of Care (Signed)
Problem: [facility] Participation in Recreation Therapeutic Interventions Goal: STG-Patient will demonstrate improved self esteem by identif STG: Self-Esteem - Within 3 treatment sessions, patient will verbalize at least 5 positive affirmation statements in one treatment session to increase self-esteem.  Outcome: Completed/Met Date Met: 01/04/17 Treatment Session 1; Completed 1 out of 1: At approximately 11:10 am, LRT met with patient in craft room. Patient verbalized 5 positive affirmation statements. Patient reported it felt like "the truth". LRT encouraged patient to continue saying positive affirmation statements.  Leonette Monarch, LRT/CTRS 03.19.18 12:01 pm Goal: STG-Other Recreation Therapy Goal (Specify) STG: Stress Management - Within 3 treatment sessions, patient will verbalize understanding of the stress management techniques in one treatment session to increase stress management skills.  Outcome: Completed/Met Date Met: 01/04/17 Treatment Session 1; Completed 1 out of 1: At approximately 11:10 am, LRT met with patient in craft room. LRT educated and provided patient with handouts on stress management techniques. Patient verbalized understanding. LRT encouraged patient to read over and practice the stress management techniques.  Leonette Monarch, LRT/CTRS 03.19.18 12:02 pm

## 2017-01-04 NOTE — Progress Notes (Signed)
CSW exchanged staff messages with Esmeralda LinksKim Grice regarding [facility]  Kim reports [facility] [place]. Garner NashGregory Rafiel Mecca, MSW, LCSW Clinical Social Worker 01/04/2017 11:44 AM

## 2017-01-04 NOTE — Discharge Summary (Signed)
Physician Discharge Summary Note  Patient:  Joel Owens is an 66 y.o., male MRN:  [id number] DOB:  Jan 16, 1951 Patient phone:  [phone] (home)  Patient address:   [address] [place] [address],  Total Time spent with patient: 30 minutes  Date of Admission:  12/29/2016 Date of Discharge: 01/04/17  Reason for Admission:  suicidality  Principal Problem: Severe major depression, single episode, without psychotic features [facility]) Discharge Diagnoses: Patient Active Problem List   Diagnosis Date Noted  . GERD (gastroesophageal reflux disease) [K21.9] 12/30/2016  . BPH (benign prostatic hyperplasia) [N40.0] 12/30/2016  . Hypertension [I10] 12/29/2016  . Severe major depression, single episode, without psychotic features (HCC) [F32.2] 12/29/2016  . Type 2 diabetes mellitus without complication, without long-term current use of insulin (HCC) [E11.9] 02/25/2016  . Herniated cervical intervertebral disc [M50.20] 07/30/2015  . Primary osteoarthritis of left knee [M17.12] 06/13/2014  . Back pain [M54.9] 07/17/2013    History of Present Illness:    66 year old man Who came to our emergency department voluntarily on March 13. He was referred to the ER by his primary care doctor Dr. Zada Owens due to suicidal ideation.  "I am having trouble coping" Patient stated he has had SI for " a while" with several different plans--he would drive into a tree or take sleeping pills.   His job is the cause of his mental and emotional state. He's been working for the same [employer] for approximately 19 years. Approximately three years ago, their CEO reduced their workforce and it resulted in all his support staff been let go. He works more than 60 hours a week, because he have to do the job of five other people. He also has not taking any vacation in 6 years  The last three months have been extremely stressful for him. He is having the thoughts of running his car into a tree and  overdosing on sleeping pills. The thoughts are increasing and they are now occurring "every hour on the hour." His sleep has decreased and his appetite fluctuates. When asked what's keeping him from ending his life, he stated "I don't know." He lives in the same home with his wife but their relationship is distant.    Substance abuse history: He says he drinks very little maybe 4 drinks total in a year no history of drug abuse. He is a former smoker  Associated Signs/Symptoms: Depression Symptoms:  depressed mood, insomnia, hopelessness, suicidal thoughts with specific plan, (Hypo) Manic Symptoms:  Irritable Mood, Anxiety Symptoms:  Excessive Worry, Psychotic Symptoms:  denies PTSD Symptoms: Negative Total Time spent with patient: 1 hour  Past Psychiatric History: No past history of psychiatric treatment. No hospitalization. No suicide attempts. Never been on any psychiatric medicine. No history of psychotic symptoms or substance abuse.   Past Medical History:  Past Medical History:  Diagnosis Date  . GERD (gastroesophageal reflux disease)   . Heart murmur    as child   no problems  . Hypertension   . Shortness of breath dyspnea    with exertion   . Sleep apnea    never got cpap     Past Surgical History:  Procedure Laterality Date  . ANTERIOR CERVICAL DECOMP/DISCECTOMY FUSION N/A 07/30/2015   Procedure: C4-5 C5-6 C6-7 Anterior cervical decompression/diskectomy/fusion;  Surgeon: Joel Harman, MD;  Location: [facility];  Service: Neurosurgery;  Laterality: N/A;  C4-5 C5-6 C6-7 Anterior cervical decompression/diskectomy/fusion  . BACK SURGERY     1980s  . KNEE ARTHROPLASTY  bilat   . ROTATOR CUFF REPAIR     left   . TONSILLECTOMY     Family History: History reviewed. No pertinent family history.   Family Psychiatric  History: he is adopted.  Does not know  Social History: Patient is married but he says that he and his wife have a very strained relationship.  They don't sleep together and he feels like she is completely unsupportive. 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 Doesn't have anything that he enjoys.  Has a son who is 43.  Lives near by and they are close.  No legal trouble History  Alcohol Use No     History  Drug Use No    Social History   Social History  . Marital status: Married    Spouse name: N/A  . Number of children: N/A  . Years of education: N/A   Social History Main Topics  . Smoking status: Former Smoker    Years: 5.00  . Smokeless tobacco: Never Used  . Alcohol use No  . Drug use: No  . Sexual activity: No   Other Topics Concern  . None   Social History Narrative  . None    Hospital Course:   66 y/o married Caucasian male who presents with new onset of depression of severe intensity and suicidal ideation---triggered by severe burn out from work  MDD severe single episode: Patient has been started on fluoxetine 20 mg po q day  Insomnia: continue trazodone 150 mg po qhs  HTN: continue norvasc 10 mg po q day, cozaar 100 mg/d and HCTZ 25 mg po qday  GERD: continue pepcid 20 mg qhs and zantac  BPH: continue flomax 0.4 mg po q day  Diabetes: Started metformin 500 mg BID  Diet: 2 grams Na and carb modified  Dispo: home today  F/u: will need to set up f/u with psychiatrist  Labs: b12 WNL, tsh WNL, lipids (high TGs and VLDL, low HDL) and HbA1c elevated (6.9%)  Patient will be discharged back to his home today. He is more hopeful and future oriented. He is denying having suicidal ideation, homicidal ideation or auditory or visual hallucinations. His appetite, energy and concentration have all improved. He is tolerating medications well. He is denying any side effects. He denies any physical complaints  Patient did not display any unsafe or disruptive behaviors during his stay here in the hospital. He participated actively in programming. He had  appropriate interactions with peers and staff. He was compliant with medications. He did not require seclusion, restraints or forced medications.  Patient has denied having any access to guns.  Staff working with the patient feels patient has improved. They do not have any concerns about the patient's safety or the safety of others upon discharge.  FMLA paperwork has been completed. Patient will need clearance from outpatient psychiatrist prior to returning to work.  Physical Findings: AIMS: Facial and Oral Movements Muscles of Facial Expression: None, normal Lips and Perioral Area: None, normal Jaw: None, normal Tongue: None, normal,Extremity Movements Upper (arms, wrists, hands, fingers): None, normal Lower (legs, knees, ankles, toes): None, normal, Trunk Movements Neck, shoulders, hips: None, normal, Overall Severity Severity of abnormal movements (highest score from questions above): None, normal Incapacitation due to abnormal movements: None, normal Patient's awareness of abnormal movements (rate only patient's report): No Awareness, Dental Status Current problems with teeth and/or dentures?: No Does patient usually wear dentures?: No  CIWA:  CIWA-Ar  Total: 0 COWS:     Musculoskeletal: Strength & Muscle Tone: within normal limits Gait & Station: normal Patient leans: N/A  Psychiatric Specialty Exam: Physical Exam  Constitutional: He is oriented to person, place, and time. He appears well-developed and well-nourished.  HENT:  Head: Normocephalic and atraumatic.  Eyes: EOM are normal.  Neck: Normal range of motion.  Respiratory: Effort normal.  Musculoskeletal: Normal range of motion.  Neurological: He is alert and oriented to person, place, and time.    Review of Systems  Constitutional: Negative.   HENT: Negative.   Eyes: Negative.   Respiratory: Negative.   Cardiovascular: Negative.   Gastrointestinal: Negative.   Genitourinary: Negative.   Musculoskeletal:  Negative.   Skin: Negative.   Neurological: Negative.   Endo/Heme/Allergies: Negative.   Psychiatric/Behavioral: Positive for depression. Negative for hallucinations, memory loss, substance abuse and suicidal ideas. The patient is nervous/anxious. The patient does not have insomnia.     Blood pressure (!) 142/68, pulse 75, temperature 98.2 F (36.8 C), resp. rate 18, height 5' 11.5" (1.816 m), weight 125.2 kg (276 lb), SpO2 98 %.Body mass index is 37.96 kg/m.  General Appearance: Well Groomed  Eye Contact:  Good  Speech:  Clear and Coherent  Volume:  Normal  Mood:  Dysphoric  Affect:  Appropriate and Congruent  Thought Process:  Linear and Descriptions of Associations: Intact  Orientation:  Full (Time, Place, and Person)  Thought Content:  Hallucinations: None  Suicidal Thoughts:  No  Homicidal Thoughts:  No  Memory:  Immediate;   Good Recent;   Good Remote;   Good  Judgement:  Good  Insight:  Good  Psychomotor Activity:  Normal  Concentration:  Concentration: Good and Attention Span: Good  Recall:  Good  Fund of Knowledge:  Good  Language:  Good  Akathisia:  No  Handed:    AIMS (if indicated):     Assets:  ArchitectCommunication Skills Financial Resources/Insurance Housing Social Support Talents/Skills Transportation Vocational/Educational  ADL's:  Intact  Cognition:  WNL  Sleep:  Number of Hours: 5.75     Have you used any form of tobacco in the last 30 days? (Cigarettes, Smokeless Tobacco, Cigars, and/or Pipes): No  Has this patient used any form of tobacco in the last 30 days? (Cigarettes, Smokeless Tobacco, Cigars, and/or Pipes) Yes, No  Blood Alcohol level:  Lab Results  Component Value Date   ETH <5 12/29/2016    Metabolic Disorder Labs:  Lab Results  Component Value Date   HGBA1C 6.9 (H) 12/30/2016   MPG 151 12/30/2016   No results found for: PROLACTIN Lab Results  Component Value Date   CHOL 160 12/30/2016   TRIG 219 (H) 12/30/2016   HDL 32 (L)  12/30/2016   CHOLHDL 5.0 12/30/2016   VLDL 44 (H) 12/30/2016   LDLCALC 84 12/30/2016    See Psychiatric Specialty Exam and Suicide Risk Assessment completed by Attending Physician prior to discharge.  Discharge destination:  Home  Is patient on multiple antipsychotic therapies at discharge:  No   Has Patient had three or more failed trials of antipsychotic monotherapy by history:  No  Recommended Plan for Multiple Antipsychotic Therapies: NA   Allergies as of 01/04/2017      Reactions   Ace Inhibitors Cough      Medication List    STOP taking these medications   CENTRUM SILVER ADULT 50+ PO   methocarbamol 500 MG tablet Commonly known as:  ROBAXIN   oxyCODONE-acetaminophen 5-325 MG tablet Commonly  known as:  PERCOCET/ROXICET   traMADol 50 MG tablet Commonly known as:  ULTRAM   triamcinolone cream 0.1 % Commonly known as:  KENALOG   vitamin C 500 MG tablet Commonly known as:  ASCORBIC ACID   Vitamin D3 5000 units Caps     TAKE these medications     Indication  amLODipine 10 MG tablet Commonly known as:  NORVASC Take 10 mg by mouth daily.  Indication:  High Blood Pressure Disorder   esomeprazole 40 MG capsule Commonly known as:  NEXIUM Take 40 mg by mouth daily at 12 noon.  Indication:  Gastroesophageal Reflux Disease   FLUoxetine 20 MG capsule Commonly known as:  PROZAC Take 1 capsule (20 mg total) by mouth daily. Start taking on:  01/05/2017  Indication:  Major Depressive Disorder   hydrochlorothiazide 25 MG tablet Commonly known as:  HYDRODIURIL Take 25 mg by mouth daily.  Indication:  High Blood Pressure Disorder   losartan 100 MG tablet Commonly known as:  COZAAR Take 100 mg by mouth daily.  Indication:  High Blood Pressure Disorder   metFORMIN 500 MG tablet Commonly known as:  GLUCOPHAGE Take 1 tablet (500 mg total) by mouth 2 (two) times daily with a meal.  Indication:  Type 2 Diabetes   ranitidine 300 MG tablet Commonly known as:   ZANTAC Take 300 mg by mouth at bedtime.  Indication:  Gastroesophageal Reflux Disease   tamsulosin 0.4 MG Caps capsule Commonly known as:  FLOMAX Take 0.4 mg by mouth daily.  Indication:  Benign Enlargement of Prostate   traZODone 150 MG tablet Commonly known as:  DESYREL Take 1 tablet (150 mg total) by mouth at bedtime.  Indication:  Trouble Sleeping      Follow-up Information    [facility]. Go on 01/06/2017.   Why:  Please attend your hospital discharge appointment at [facility] on 01/06/17 at 12:30pm.  Please bring photo ID and a copy of your hospital discharge paperwork. Contact information: [address] [place] [address] [phone]           >30 minutes. >50 % of the time was spent in coordination of care.  Signed: Jimmy Footman, MD 01/04/2017, 2:06 PM

## 2019-04-20 ENCOUNTER — Other Ambulatory Visit: Payer: Self-pay | Admitting: Family Medicine

## 2019-04-20 DIAGNOSIS — Z136 Encounter for screening for cardiovascular disorders: Secondary | ICD-10-CM

## 2019-08-05 ENCOUNTER — Encounter: Payer: Self-pay | Admitting: Emergency Medicine

## 2019-08-05 ENCOUNTER — Emergency Department
Admission: EM | Admit: 2019-08-05 | Discharge: 2019-08-05 | Disposition: A | Discharge status: Home / Self Care | Payer: Medicare Other | Attending: Emergency Medicine | Admitting: Emergency Medicine

## 2019-08-05 ENCOUNTER — Emergency Department: Payer: Medicare Other

## 2019-08-05 ENCOUNTER — Other Ambulatory Visit: Payer: Self-pay

## 2019-08-05 DIAGNOSIS — K5900 Constipation, unspecified: Secondary | ICD-10-CM | POA: Insufficient documentation

## 2019-08-05 DIAGNOSIS — Z79899 Other long term (current) drug therapy: Secondary | ICD-10-CM | POA: Insufficient documentation

## 2019-08-05 DIAGNOSIS — I10 Essential (primary) hypertension: Secondary | ICD-10-CM | POA: Insufficient documentation

## 2019-08-05 DIAGNOSIS — K5792 Diverticulitis of intestine, part unspecified, without perforation or abscess without bleeding: Secondary | ICD-10-CM | POA: Diagnosis not present

## 2019-08-05 DIAGNOSIS — R1032 Left lower quadrant pain: Secondary | ICD-10-CM

## 2019-08-05 DIAGNOSIS — R109 Unspecified abdominal pain: Secondary | ICD-10-CM

## 2019-08-05 DIAGNOSIS — E119 Type 2 diabetes mellitus without complications: Secondary | ICD-10-CM | POA: Diagnosis not present

## 2019-08-05 DIAGNOSIS — Z7984 Long term (current) use of oral hypoglycemic drugs: Secondary | ICD-10-CM | POA: Insufficient documentation

## 2019-08-05 DIAGNOSIS — Z87891 Personal history of nicotine dependence: Secondary | ICD-10-CM | POA: Insufficient documentation

## 2019-08-05 LAB — CBC WITH DIFFERENTIAL/PLATELET
Abs Immature Granulocytes: 0.03 10*3/uL (ref 0.00–0.07)
Basophils Absolute: 0.1 10*3/uL (ref 0.0–0.1)
Basophils Relative: 1 %
Eosinophils Absolute: 0.3 10*3/uL (ref 0.0–0.5)
Eosinophils Relative: 3 %
HCT: 41.1 % (ref 39.0–52.0)
Hemoglobin: 14 g/dL (ref 13.0–17.0)
Immature Granulocytes: 0 %
Lymphocytes Relative: 26 %
Lymphs Abs: 2.4 10*3/uL (ref 0.7–4.0)
MCH: 29.7 pg (ref 26.0–34.0)
MCHC: 34.1 g/dL (ref 30.0–36.0)
MCV: 87.3 fL (ref 80.0–100.0)
Monocytes Absolute: 0.8 10*3/uL (ref 0.1–1.0)
Monocytes Relative: 8 %
Neutro Abs: 5.5 10*3/uL (ref 1.7–7.7)
Neutrophils Relative %: 62 %
Platelets: 330 10*3/uL (ref 150–400)
RBC: 4.71 MIL/uL (ref 4.22–5.81)
RDW: 12.5 % (ref 11.5–15.5)
WBC: 9 10*3/uL (ref 4.0–10.5)
nRBC: 0 % (ref 0.0–0.2)

## 2019-08-05 LAB — BASIC METABOLIC PANEL
Anion gap: 7 (ref 5–15)
BUN: 25 mg/dL — ABNORMAL HIGH (ref 8–23)
CO2: 25 mmol/L (ref 22–32)
Calcium: 8.7 mg/dL — ABNORMAL LOW (ref 8.9–10.3)
Chloride: 106 mmol/L (ref 98–111)
Creatinine, Ser: 1.18 mg/dL (ref 0.61–1.24)
GFR calc Af Amer: >60 mL/min (ref >60)
GFR calc non Af Amer: >60 mL/min (ref >60)
Glucose, Bld: 159 mg/dL — ABNORMAL HIGH (ref 70–99)
Potassium: 3.6 mmol/L (ref 3.5–5.1)
Sodium: 138 mmol/L (ref 135–145)

## 2019-08-05 LAB — URINALYSIS, COMPLETE (UACMP) WITH MICROSCOPIC
Bacteria, UA: NONE SEEN
Bilirubin Urine: NEGATIVE
Glucose, UA: 50 mg/dL — AB
Hgb urine dipstick: NEGATIVE
Ketones, ur: NEGATIVE mg/dL
Leukocytes,Ua: NEGATIVE
Nitrite: NEGATIVE
Protein, ur: NEGATIVE mg/dL
Specific Gravity, Urine: 1.027 (ref 1.005–1.030)
WBC, UA: NONE SEEN WBC/hpf (ref 0–5)
pH: 5 (ref 5.0–8.0)

## 2019-08-05 MED ORDER — OXYCODONE-ACETAMINOPHEN 5-325 MG PO TABS
1.0000 | ORAL_TABLET | ORAL | Status: DC | PRN
  Administered 2019-08-05: 1 via ORAL
  Filled 2019-08-05: qty 1

## 2019-08-05 MED ORDER — KETOROLAC TROMETHAMINE 30 MG/ML IJ SOLN
10.0000 mg | Freq: Once | INTRAMUSCULAR | Status: AC
  Administered 2019-08-05: 04:00:00 9.9 mg via INTRAVENOUS
  Filled 2019-08-05: qty 1

## 2019-08-05 MED ORDER — AMOXICILLIN-POT CLAVULANATE 875-125 MG PO TABS
1.0000 | ORAL_TABLET | Freq: Once | ORAL | Status: AC
  Administered 2019-08-05: 1 via ORAL
  Filled 2019-08-05: qty 1

## 2019-08-05 MED ORDER — OXYCODONE-ACETAMINOPHEN 5-325 MG PO TABS: 1.0000 | ORAL_TABLET | ORAL | 0 refills | Status: DC | PRN

## 2019-08-05 MED ORDER — AMOXICILLIN-POT CLAVULANATE 875-125 MG PO TABS: 1.0000 | ORAL_TABLET | Freq: Two times a day (BID) | ORAL | 0 refills | Status: DC

## 2019-08-05 MED ORDER — ONDANSETRON 4 MG PO TBDP: 4.0000 mg | ORAL_TABLET | Freq: Three times a day (TID) | ORAL | 0 refills | Status: DC | PRN

## 2019-08-05 MED ORDER — LACTULOSE 10 GM/15ML PO SOLN: 20.0000 g | Freq: Every day | ORAL | 0 refills | Status: DC | PRN

## 2019-08-05 NOTE — ED Triage Notes (Signed)
Pt with left sided flank pain that began last pm. Pt denies nausea, vomiting. Pt denies fever.

## 2019-08-05 NOTE — ED Provider Notes (Signed)
[facility] Emergency Department Provider Note   ____________________________________________   First MD Initiated Contact with Patient 08/05/19 [phone]     (approximate)  I have reviewed the triage vital signs and the nursing notes.   HISTORY  Chief Complaint Flank Pain    HPI Joel Owens is a 68 y.o. male who presents to the ED from home with a chief complaint of left flank pain.  Patient reports a 1 day history of left-sided flank pain not associated with nausea or vomiting.  Denies fever, cough, chest pain, shortness of breath, dysuria, diarrhea.  Denies recent travel or trauma.       Past Medical History:  Diagnosis Date   GERD (gastroesophageal reflux disease)    Heart murmur    as child   no problems   Hypertension    Shortness of breath dyspnea    with exertion    Sleep apnea    never got cpap   Diverticulitis  Patient Active Problem List   Diagnosis Date Noted   GERD (gastroesophageal reflux disease) 12/30/2016   BPH (benign prostatic hyperplasia) 12/30/2016   Hypertension 12/29/2016   Severe major depression, single episode, without psychotic features (HCC) 12/29/2016   Type 2 diabetes mellitus without complication, without long-term current use of insulin (HCC) 02/25/2016   Herniated cervical intervertebral disc 07/30/2015   Primary osteoarthritis of left knee 06/13/2014   Back pain 07/17/2013    Past Surgical History:  Procedure Laterality Date   ANTERIOR CERVICAL DECOMP/DISCECTOMY FUSION N/A 07/30/2015   Procedure: C4-5 C5-6 C6-7 Anterior cervical decompression/diskectomy/fusion;  Surgeon: Maeola HarmanJoseph Stern, MD;  Location: [facility];  Service: Neurosurgery;  Laterality: N/A;  C4-5 C5-6 C6-7 Anterior cervical decompression/diskectomy/fusion   BACK SURGERY     1980s   KNEE ARTHROPLASTY     bilat    ROTATOR CUFF REPAIR     left    TONSILLECTOMY      Prior to Admission medications   Medication Sig  Start Date End Date Taking? Authorizing Provider  amLODipine (NORVASC) 10 MG tablet Take 10 mg by mouth daily.    [provider]  esomeprazole (NEXIUM) 40 MG capsule Take 40 mg by mouth daily at 12 noon.    [provider]  FLUoxetine (PROZAC) 20 MG capsule Take 1 capsule (20 mg total) by mouth daily. 01/05/17   Jimmy FootmanHernandez-Gonzalez, Andrea, MD  hydrochlorothiazide (HYDRODIURIL) 25 MG tablet Take 25 mg by mouth daily.    [provider]  losartan (COZAAR) 100 MG tablet Take 100 mg by mouth daily.     [provider]  metFORMIN (GLUCOPHAGE) 500 MG tablet Take 1 tablet (500 mg total) by mouth 2 (two) times daily with a meal. 01/04/17   Jimmy FootmanHernandez-Gonzalez, Andrea, MD  ranitidine (ZANTAC) 300 MG tablet Take 300 mg by mouth at bedtime.    [provider]  tamsulosin (FLOMAX) 0.4 MG CAPS capsule Take 0.4 mg by mouth daily.    [provider]  traZODone (DESYREL) 150 MG tablet Take 1 tablet (150 mg total) by mouth at bedtime. 01/04/17   Jimmy FootmanHernandez-Gonzalez, Andrea, MD    Allergies Ace inhibitors  No family history on file.  Social History Social History   Tobacco Use   Smoking status: Former Smoker    Years: 5.00   Smokeless tobacco: Never Used  Substance Use Topics   Alcohol use: No   Drug use: No    Review of Systems  Constitutional: No fever/chills Eyes: No visual changes. ENT:  No sore throat. Cardiovascular: Denies chest pain. Respiratory: Denies shortness of breath. Gastrointestinal: Positive for left flank and abdominal pain.  No nausea, no vomiting.  No diarrhea.  No constipation. Genitourinary: Negative for dysuria. Musculoskeletal: Negative for back pain. Skin: Negative for rash. Neurological: Negative for headaches, focal weakness or numbness.   ____________________________________________   PHYSICAL EXAM:  VITAL SIGNS: ED Triage Vitals  Enc Vitals Group     BP 08/05/19 0230 (!) 156/70     Pulse Rate 08/05/19  0230 76     Resp 08/05/19 0230 18     Temp 08/05/19 0230 98.2 F (36.8 C)     Temp Source 08/05/19 0230 Oral     SpO2 08/05/19 0230 96 %     Weight 08/05/19 0223 273 lb (123.8 kg)     Height 08/05/19 0223 6' (1.829 m)     Head Circumference --      Peak Flow --      Pain Score 08/05/19 0223 8     Pain Loc --      Pain Edu? --      Excl. in GC? --     Constitutional: Alert and oriented. Well appearing and in no acute distress. Eyes: Conjunctivae are normal. PERRL. EOMI. Head: Atraumatic. Nose: No congestion/rhinnorhea. Mouth/Throat: Mucous membranes are moist.  Oropharynx non-erythematous. Neck: No stridor.   Cardiovascular: Normal rate, regular rhythm. Grossly normal heart sounds.  Good peripheral circulation. Respiratory: Normal respiratory effort.  No retractions. Lungs CTAB. Gastrointestinal: Soft and mildly tender to palpation left lower quadrant without rebound or guarding. No distention. No abdominal bruits. No CVA tenderness. Musculoskeletal: No lower extremity tenderness nor edema.  No joint effusions. Neurologic:  Normal speech and language. No gross focal neurologic deficits are appreciated. No gait instability. Skin:  Skin is warm, dry and intact. No rash noted.  No vesicles. Psychiatric: Mood and affect are normal. Speech and behavior are normal.  ____________________________________________   LABS (all labs ordered are listed, but only abnormal results are displayed)  Labs Reviewed  BASIC METABOLIC PANEL - Abnormal; Notable for the following components:      Result Value   Glucose, Bld 159 (*)    BUN 25 (*)    Calcium 8.7 (*)    All other components within normal limits  URINALYSIS, COMPLETE (UACMP) WITH MICROSCOPIC - Abnormal; Notable for the following components:   Color, Urine YELLOW (*)    APPearance CLEAR (*)    Glucose, UA 50 (*)    All other components within normal limits  CBC WITH DIFFERENTIAL/PLATELET  URINALYSIS, COMPLETE (UACMP) WITH  MICROSCOPIC   ____________________________________________  EKG  None ____________________________________________  RADIOLOGY  ED MD interpretation: Mild diverticulitis  Official radiology report(s): Ct Renal Stone Study  Result Date: 08/05/2019 CLINICAL DATA:  Left flank pain. EXAM: CT ABDOMEN AND PELVIS WITHOUT CONTRAST TECHNIQUE: Multidetector CT imaging of the abdomen and pelvis was performed following the standard protocol without IV contrast. COMPARISON:  CT 11/30/2005 FINDINGS: Lower chest: Moderate hiatal hernia with compressive atelectasis in the left lower lobe. Heart is normal in size. Trace pericardial fluid. Hepatobiliary: Borderline hepatic steatosis. No focal abnormality on noncontrast exam. Gallbladder physiologically distended, no calcified stone. No biliary dilatation. Pancreas: No ductal dilatation or inflammation. Punctate calcification about the pancreatic neck. Spleen: Normal in size without focal abnormality. Adrenals/Urinary Tract: Stable 11 mm left adrenal adenoma. Normal right adrenal gland. No renal stone or hydronephrosis. No significant perinephric edema. Both ureters are decompressed without stones along the course. Simple  3.1 cm cyst in the mid left kidney. Tiny exophytic low-density lesion from the lower left kidney is too small to accurately characterize but likely cyst. Urinary bladder is partially distended. No bladder stone or wall thickening. No urethral stone visualized. Stomach/Bowel: Moderate hiatal hernia. Ingested material within the stomach. No small bowel obstruction, inflammation, or wall thickening. Normal appendix. Moderate stool burden throughout the colon. Colonic diverticulosis, prominent in the distal descending and sigmoid. Suggestion of faint edema around a diverticulum in the proximal sigmoid, series 2, image 71. Vascular/Lymphatic: Moderate aortic atherosclerosis. No aneurysm. No enlarged lymph nodes in the abdomen or pelvis. Reproductive:  Prostate is unremarkable. Other: No free air or intra-abdominal abscess. No ascites. Fat containing bilateral inguinal hernias. Fat containing umbilical hernia. Musculoskeletal: Diffuse degenerative change in the spine. Punctate bone island within T11. IMPRESSION: 1. No renal stones or obstructive uropathy. 2. Colonic diverticulosis, prominent in the distal descending and sigmoid colon. Suggestion of faint edema around a diverticulum in the proximal sigmoid colon, suggesting mild diverticulitis. No perforation or abscess. 3. Moderate hiatal hernia. 4. Borderline hepatic steatosis. 5. Fat containing umbilical and bilateral inguinal hernias. Aortic Atherosclerosis (ICD10-I70.0). Electronically Signed   By: Keith Rake M.D.   On: 08/05/2019 02:51    ____________________________________________   PROCEDURES  Procedure(s) performed (including Critical Care):  Procedures   ____________________________________________   INITIAL IMPRESSION / ASSESSMENT AND PLAN / ED COURSE  As part of my medical decision making, I reviewed the following data within the [place] notes reviewed and incorporated, Labs reviewed, Old chart reviewed, Radiograph reviewed, Notes from prior ED visits and [place] Controlled Substance Database     FREDERICH MONTILLA was evaluated in Emergency Department on 08/05/2019 for the symptoms described in the history of present illness. He was evaluated in the context of the global COVID-19 pandemic, which necessitated consideration that the patient might be at risk for infection with the SARS-CoV-2 virus that causes COVID-19. Institutional protocols and algorithms that pertain to the evaluation of patients at risk for COVID-19 are in a state of rapid change based on information released by regulatory bodies including the CDC and federal and state organizations. These policies and algorithms were followed during the patient's care in the ED.    68 year old  male who presents with left flank pain. Differential diagnosis includes, but is not limited to, acute appendicitis, renal colic, testicular torsion, urinary tract infection/pyelonephritis, prostatitis,  epididymitis, diverticulitis, small bowel obstruction or ileus, colitis, abdominal aortic aneurysm, gastroenteritis, hernia, etc.  Laboratory results unremarkable.  CT scan demonstrates mild diverticulitis.  Will start Augmentin, analgesia, antiemetic as needed and patient will follow up with his PCP closely.  Strict return precautions given.  Patient verbalizes understanding agrees with plan of care.      ____________________________________________   FINAL CLINICAL IMPRESSION(S) / ED DIAGNOSES  Final diagnoses:  Left lower quadrant abdominal pain  Diverticulitis  Constipation, unspecified constipation type     ED Discharge Orders    None       Note:  This document was prepared using Dragon voice recognition software and may include unintentional dictation errors.   Paulette Blanch, MD 08/05/19 [phone]

## 2019-08-05 NOTE — Discharge Instructions (Signed)
1.  Take Lactulose as needed for bowel movements. 2.  Take antibiotic as prescribed (Augmentin 875 mg twice daily x7 days). 3.  You may take medicines as needed for pain and nausea (Percocet/Zofran #20). 4.  Return to the ER for worsening symptoms, persistent vomiting, fever, difficulty breathing or other concerns.

## 2020-01-30 ENCOUNTER — Other Ambulatory Visit: Payer: Self-pay | Admitting: Physician Assistant

## 2020-01-30 DIAGNOSIS — M12811 Other specific arthropathies, not elsewhere classified, right shoulder: Secondary | ICD-10-CM

## 2020-02-12 ENCOUNTER — Ambulatory Visit: Payer: Managed Care, Other (non HMO)

## 2020-03-23 ENCOUNTER — Other Ambulatory Visit: Payer: Self-pay

## 2020-03-23 ENCOUNTER — Ambulatory Visit
Admission: EM | Admit: 2020-03-23 | Discharge: 2020-03-23 | Disposition: A | Discharge status: Home / Self Care | Payer: Worker's Compensation | Attending: Family Medicine | Admitting: Family Medicine

## 2020-03-23 ENCOUNTER — Ambulatory Visit (INDEPENDENT_AMBULATORY_CARE_PROVIDER_SITE_OTHER): Payer: Worker's Compensation

## 2020-03-23 DIAGNOSIS — M25561 Pain in right knee: Secondary | ICD-10-CM

## 2020-03-23 MED ORDER — MELOXICAM 15 MG PO TABS: 15.0000 mg | ORAL_TABLET | Freq: Every day | ORAL | 0 refills | Status: DC | PRN

## 2020-03-23 NOTE — ED Triage Notes (Signed)
Patient states that he was working at 12am this morning and there is a mat in front of his chair and when he went to move forward the right knee pop. Patient states that he has had pain since then, pain is worse when moving right or left.

## 2020-03-23 NOTE — Discharge Instructions (Signed)
Take medication as prescribed. Rest. Drink plenty of fluids. Ice. Use crutches.   Follow-up with occupational health this coming week.  See above to call Monday to schedule follow-up.  Follow up with your primary care physician this week as needed. Return to Urgent care for new or worsening concerns.

## 2020-03-23 NOTE — ED Provider Notes (Signed)
[facility] ____________________________________________  Time seen: Approximately 2:17 PM  I have reviewed the triage vital signs and the nursing notes.   HISTORY  Chief Complaint Work Related Injury and Knee Injury (right)   HPI Joel Owens is a 69 y.o. male presenting for evaluation of right knee pain after injury that occurred overnight while working.  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  Reports he was sitting in a chair that had wheels, and when he went to move forward the chair was stuck on the mat and he had a sudden pop in his knee with associated pain.  States he has had pain to the area since then.  Pain is worse with walking, movement as well as rotation.  Able to still weight-bear.  Does not feel like knee is giving out on him.  Patient has had a previous partial arthroplasty to right knee.  Denies pain radiation, paresthesias, direct fall or other injury.  Denies aggravating alleviating factors otherwise.  Reports otherwise normal.   Past Medical History:  Diagnosis Date  . GERD (gastroesophageal reflux disease)   . Heart murmur    as child   no problems  . Hypertension   . Shortness of breath dyspnea    with exertion   . Sleep apnea    never got cpap     Patient Active Problem List   Diagnosis Date Noted  . GERD (gastroesophageal reflux disease) 12/30/2016  . BPH (benign prostatic hyperplasia) 12/30/2016  . Hypertension 12/29/2016  . Severe major depression, single episode, without psychotic features ([place]) 12/29/2016  . Type 2 diabetes mellitus without complication, without long-term current use of insulin ([place]) 02/25/2016  . Herniated cervical intervertebral disc 07/30/2015  . Primary osteoarthritis of left knee 06/13/2014  . Back pain 07/17/2013    Past Surgical History:  Procedure Laterality Date  . ANTERIOR CERVICAL DECOMP/DISCECTOMY FUSION N/A 07/30/2015   Procedure: C4-5 C5-6 C6-7 Anterior cervical  decompression/diskectomy/fusion;  Surgeon: Erline Levine, MD;  Location: [facility];  Service: Neurosurgery;  Laterality: N/A;  C4-5 C5-6 C6-7 Anterior cervical decompression/diskectomy/fusion  . BACK SURGERY     1980s  . KNEE ARTHROPLASTY     bilat   . ROTATOR CUFF REPAIR     left   . TONSILLECTOMY       No current facility-administered medications for this encounter.  Current Outpatient Medications:  .  amLODipine (NORVASC) 10 MG tablet, Take 10 mg by mouth daily., Disp: , Rfl:  .  esomeprazole (NEXIUM) 40 MG capsule, Take 40 mg by mouth daily at 12 noon., Disp: , Rfl:  .  hydrochlorothiazide (HYDRODIURIL) 25 MG tablet, Take 25 mg by mouth daily., Disp: , Rfl:  .  losartan (COZAAR) 100 MG tablet, Take 100 mg by mouth daily. , Disp: , Rfl:  .  amoxicillin-clavulanate (AUGMENTIN) 875-125 MG tablet, Take 1 tablet by mouth 2 (two) times daily., Disp: 14 tablet, Rfl: 0 .  FLUoxetine (PROZAC) 20 MG capsule, Take 1 capsule (20 mg total) by mouth daily., Disp: 30 capsule, Rfl: 0 .  lactulose (CHRONULAC) 10 GM/15ML solution, Take 30 mLs (20 g total) by mouth daily as needed for mild constipation., Disp: 120 mL, Rfl: 0 .  meloxicam (MOBIC) 15 MG tablet, Take 1 tablet (15 mg total) by mouth daily as needed., Disp: 10 tablet, Rfl: 0 .  metFORMIN (GLUCOPHAGE) 500 MG tablet, Take 1 tablet (500 mg total) by mouth 2 (two) times daily with a meal., Disp: 60 tablet, Rfl: 0 .  ondansetron (ZOFRAN ODT) 4 MG disintegrating tablet, Take 1 tablet (4 mg total) by mouth every 8 (eight) hours as needed for nausea or vomiting., Disp: 20 tablet, Rfl: 0 .  oxyCODONE-acetaminophen (PERCOCET/ROXICET) 5-325 MG tablet, Take 1 tablet by mouth every 4 (four) hours as needed for severe pain., Disp: 20 tablet, Rfl: 0 .  ranitidine (ZANTAC) 300 MG tablet, Take 300 mg by mouth at bedtime., Disp: , Rfl:  .  tamsulosin (FLOMAX) 0.4 MG CAPS capsule, Take 0.4 mg by mouth daily., Disp: , Rfl:  .  traZODone (DESYREL) 150 MG  tablet, Take 1 tablet (150 mg total) by mouth at bedtime., Disp: 30 tablet, Rfl: 0  Allergies Ace inhibitors  Family History  Adopted: Yes    Social History Social History   Tobacco Use  . Smoking status: Former Smoker    Years: 5.00  . Smokeless tobacco: Never Used  Substance Use Topics  . Alcohol use: No  . Drug use: No    Review of Systems Constitutional: No fever Cardiovascular: Denies chest pain. Respiratory: Denies shortness of breath. Musculoskeletal: Right knee pain. Skin: Negative for rash..  ____________________________________________   PHYSICAL EXAM:  VITAL SIGNS: ED Triage Vitals  Enc Vitals Group     BP 03/23/20 1220 (!) 153/80     Pulse Rate 03/23/20 1220 72     Resp 03/23/20 1220 18     Temp 03/23/20 1220 98.2 F (36.8 C)     Temp Source 03/23/20 1220 Oral     SpO2 03/23/20 1220 97 %     Weight 03/23/20 1216 275 lb (124.7 kg)     Height 03/23/20 1216 6' (1.829 m)     Head Circumference --      Peak Flow --      Pain Score 03/23/20 1216 10     Pain Loc --      Pain Edu? --      Excl. in GC? --     Constitutional: Alert and oriented. Well appearing and in no acute distress. Eyes: Conjunctivae are normal.  ENT      Head: Normocephalic and atraumatic. Cardiovascular:Good peripheral circulation. Respiratory: Normal respiratory effort without tachypnea nor retractions.  Musculoskeletal: Bilateral pedal pulses equal and easily palpated.   Except: Right mid to lateral knee tenderness direct palpation, no pain with anterior posterior drawer test, mild pain with medial lateral stress, able to fully extend and flex knee but with pain, antalgic gait.  Right lower extremity otherwise nontender. Neurologic:  Normal speech and language. Speech is normal.  Skin:  Skin is warm, dry and intact. No rash noted. Psychiatric: Mood and affect are normal. Speech and behavior are normal. Patient exhibits appropriate insight and judgment     ___________________________________________   LABS (all labs ordered are listed, but only abnormal results are displayed)  Labs Reviewed - No data to display ____________________________________________  RADIOLOGY  DG Knee Complete 4 Views Right  Result Date: 03/23/2020 CLINICAL DATA:  Pain, popping EXAM: RIGHT KNEE - COMPLETE 4+ VIEW COMPARISON:  None. FINDINGS: No fracture or dislocation of the right knee. Status post medial compartment arthroplasty. Mild arthrosis of the remaining compartments. Probable small knee joint effusion. IMPRESSION: 1.  No fracture or dislocation of the right knee. 2. Status post medial compartment arthroplasty. Mild arthrosis of the remaining compartments. 3.  Probable small knee joint effusion. Electronically Signed   By: Lauralyn Primes M.D.   On: 03/23/2020 13:14   ____________________________________________   PROCEDURES Procedures    INITIAL  IMPRESSION / ASSESSMENT AND PLAN / ED COURSE  Pertinent labs & imaging results that were available during my care of the patient were reviewed by me and considered in my medical decision making (see chart for details).  Well-appearing patient.  No acute distress.  Right knee pain with pops since last night after above injury.  Right knee x-ray as above per radiologist, reviewed, no fracture or dislocation, post medial compartment arthroplasty, probable small joint effusion.  Discussed the patient concern for sprain versus ligamentous meniscal injury.  Recommend rest, ice, supportive care.  Pain with weightbearing, crutches given, directed to use crutches for 3 days and to follow-up with occupational health.  Will treat with Mobic.  Supportive care.  Occupational with information given patient to call Monday. Discussed indication, risks and benefits of medications with patient.    Discussed follow up and return parameters including no resolution or any worsening concerns. Patient verbalized understanding and agreed to  plan.   ____________________________________________   FINAL CLINICAL IMPRESSION(S) / ED DIAGNOSES  Final diagnoses:  Acute pain of right knee     ED Discharge Orders         Ordered    meloxicam (MOBIC) 15 MG tablet  Daily PRN     03/23/20 1321           Note: This dictation was prepared with Dragon dictation along with smaller phrase technology. Any transcriptional errors that result from this process are unintentional.         Renford Dills, NP 03/23/20 1423

## 2020-04-17 ENCOUNTER — Other Ambulatory Visit: Payer: Self-pay

## 2020-04-17 ENCOUNTER — Emergency Department: Payer: Medicare Other

## 2020-04-17 ENCOUNTER — Encounter: Payer: Self-pay | Admitting: Emergency Medicine

## 2020-04-17 ENCOUNTER — Emergency Department
Admission: EM | Admit: 2020-04-17 | Discharge: 2020-04-17 | Disposition: A | Discharge status: Home / Self Care | Payer: Medicare Other | Attending: Emergency Medicine | Admitting: Emergency Medicine

## 2020-04-17 DIAGNOSIS — I10 Essential (primary) hypertension: Secondary | ICD-10-CM | POA: Diagnosis not present

## 2020-04-17 DIAGNOSIS — S0990XA Unspecified injury of head, initial encounter: Secondary | ICD-10-CM | POA: Diagnosis present

## 2020-04-17 DIAGNOSIS — Z7984 Long term (current) use of oral hypoglycemic drugs: Secondary | ICD-10-CM | POA: Diagnosis not present

## 2020-04-17 DIAGNOSIS — W19XXXA Unspecified fall, initial encounter: Secondary | ICD-10-CM

## 2020-04-17 DIAGNOSIS — Z87891 Personal history of nicotine dependence: Secondary | ICD-10-CM | POA: Insufficient documentation

## 2020-04-17 DIAGNOSIS — Y92512 Supermarket, store or market as the place of occurrence of the external cause: Secondary | ICD-10-CM | POA: Diagnosis not present

## 2020-04-17 DIAGNOSIS — Z79899 Other long term (current) drug therapy: Secondary | ICD-10-CM | POA: Insufficient documentation

## 2020-04-17 DIAGNOSIS — E119 Type 2 diabetes mellitus without complications: Secondary | ICD-10-CM | POA: Diagnosis not present

## 2020-04-17 DIAGNOSIS — S4991XA Unspecified injury of right shoulder and upper arm, initial encounter: Secondary | ICD-10-CM | POA: Diagnosis not present

## 2020-04-17 DIAGNOSIS — Z23 Encounter for immunization: Secondary | ICD-10-CM | POA: Insufficient documentation

## 2020-04-17 DIAGNOSIS — W108XXA Fall (on) (from) other stairs and steps, initial encounter: Secondary | ICD-10-CM | POA: Insufficient documentation

## 2020-04-17 DIAGNOSIS — Y939 Activity, unspecified: Secondary | ICD-10-CM | POA: Diagnosis not present

## 2020-04-17 DIAGNOSIS — Y999 Unspecified external cause status: Secondary | ICD-10-CM | POA: Diagnosis not present

## 2020-04-17 MED ORDER — TETANUS-DIPHTH-ACELL PERTUSSIS 5-2.5-18.5 LF-MCG/0.5 IM SUSP
0.5000 mL | Freq: Once | INTRAMUSCULAR | Status: AC
  Administered 2020-04-17: 0.5 mL via INTRAMUSCULAR
  Filled 2020-04-17: qty 0.5

## 2020-04-17 NOTE — ED Triage Notes (Signed)
Presents via EMS s/p fall  States his knees gave out and fell backwards   Having pain to bilateral shoulders and back of head

## 2020-04-17 NOTE — ED Provider Notes (Signed)
[facility] Emergency Department Provider Note  ____________________________________________   First MD Initiated Contact with Patient 04/17/20 1557     (approximate)  I have reviewed the triage vital signs and the nursing notes.   HISTORY  Chief Complaint Fall    HPI Joel Owens is a 69 y.o. male presents emergency department after a fall.  Patient was recently admitted to [facility] for back pain and was given an epidural injection and released on Monday.  Patient states that he started feeling much better and so to go the grocery store when he got to the bottom of the steps his legs gave way and he fell.  Complaining of right shoulder pain, hit his head, left fifth finger may have a splinter in it.  Unsure of his last tetanus.  No numbness or tingling and no back pain at this time.    Past Medical History:  Diagnosis Date  . GERD (gastroesophageal reflux disease)   . Heart murmur    as child   no problems  . Hypertension   . Shortness of breath dyspnea    with exertion   . Sleep apnea    never got cpap     Patient Active Problem List   Diagnosis Date Noted  . GERD (gastroesophageal reflux disease) 12/30/2016  . BPH (benign prostatic hyperplasia) 12/30/2016  . Hypertension 12/29/2016  . Severe major depression, single episode, without psychotic features ([facility]) 12/29/2016  . Type 2 diabetes mellitus without complication, without long-term current use of insulin (H[facility]) 02/25/2016  . Herniated cervical intervertebral disc 07/30/2015  . Primary osteoarthritis of left knee 06/13/2014  . Back pain 07/17/2013    Past Surgical History:  Procedure Laterality Date  . ANTERIOR CERVICAL DECOMP/DISCECTOMY FUSION N/A 07/30/2015   Procedure: C4-5 C5-6 C6-7 Anterior cervical decompression/diskectomy/fusion;  Surgeon: Maeola Harman, MD;  Location: [facility];  Service: Neurosurgery;  Laterality: N/A;  C4-5 C5-6 C6-7 Anterior cervical  decompression/diskectomy/fusion  . BACK SURGERY     1980s  . KNEE ARTHROPLASTY     bilat   . ROTATOR CUFF REPAIR     left   . TONSILLECTOMY      Prior to Admission medications   Medication Sig Start Date End Date Taking? Authorizing Provider  amLODipine (NORVASC) 10 MG tablet Take 10 mg by mouth daily.    [provider]  amoxicillin-clavulanate (AUGMENTIN) 875-125 MG tablet Take 1 tablet by mouth 2 (two) times daily. 08/05/19   Irean Hong, MD  esomeprazole (NEXIUM) 40 MG capsule Take 40 mg by mouth daily at 12 noon.    [provider]  FLUoxetine (PROZAC) 20 MG capsule Take 1 capsule (20 mg total) by mouth daily. 01/05/17   Jimmy Footman, MD  hydrochlorothiazide (HYDRODIURIL) 25 MG tablet Take 25 mg by mouth daily.    [provider]  lactulose (CHRONULAC) 10 GM/15ML solution Take 30 mLs (20 g total) by mouth daily as needed for mild constipation. 08/05/19   Irean Hong, MD  losartan (COZAAR) 100 MG tablet Take 100 mg by mouth daily.     [provider]  meloxicam (MOBIC) 15 MG tablet Take 1 tablet (15 mg total) by mouth daily as needed. 03/23/20   Renford Dills, NP  metFORMIN (GLUCOPHAGE) 500 MG tablet Take 1 tablet (500 mg total) by mouth 2 (two) times daily with a meal. 01/04/17   Jimmy Footman, MD  ondansetron (ZOFRAN ODT) 4 MG disintegrating tablet Take 1 tablet (4 mg total) by  mouth every 8 (eight) hours as needed for nausea or vomiting. 08/05/19   Irean Hong, MD  tamsulosin (FLOMAX) 0.4 MG CAPS capsule Take 0.4 mg by mouth daily.    [provider]  traZODone (DESYREL) 150 MG tablet Take 1 tablet (150 mg total) by mouth at bedtime. 01/04/17   Jimmy Footman, MD    Allergies Ace inhibitors  Family History  Adopted: Yes    Social History Social History   Tobacco Use  . Smoking status: Former Smoker    Years: 5.00  . Smokeless tobacco: Never Used  Vaping Use  . Vaping Use: Never used    Substance Use Topics  . Alcohol use: No  . Drug use: No    Review of Systems  Constitutional: No fever/chills Eyes: No visual changes. ENT: No sore throat. Respiratory: Denies cough Cardiovascular: Denies chest pain Gastrointestinal: Denies abdominal pain Genitourinary: Negative for dysuria. Musculoskeletal: Negative for back pain.  Positive for right shoulder pain Skin: Negative for rash. Psychiatric: no mood changes,     ____________________________________________   PHYSICAL EXAM:  VITAL SIGNS: ED Triage Vitals  Enc Vitals Group     BP 04/17/20 1555 (!) 167/87     Pulse Rate 04/17/20 1555 95     Resp 04/17/20 1555 20     Temp 04/17/20 1555 98.5 F (36.9 C)     Temp Source 04/17/20 1555 Oral     SpO2 04/17/20 1555 97 %     Weight 04/17/20 1557 275 lb (124.7 kg)     Height 04/17/20 1557 5' 11.5" (1.816 m)     Head Circumference --      Peak Flow --      Pain Score 04/17/20 1601 4     Pain Loc --      Pain Edu? --      Excl. in GC? --     Constitutional: Alert and oriented. Well appearing and in no acute distress. Eyes: Conjunctivae are normal.  Head: Atraumatic. Nose: No congestion/rhinnorhea. Mouth/Throat: Mucous membranes are moist.   Neck:  supple no lymphadenopathy noted Cardiovascular: Normal rate, regular rhythm.  Respiratory: Normal respiratory effort.  No retractions,  GU: deferred Musculoskeletal: Decreased range of motion of the right shoulder, area is tender to palpation at the joint, grip is equal bilaterally, left fifth finger may have a foreign body, the areas scabbed over with blood at this time, neurovascular is intact, C-spine minimally tender neurologic:  Normal speech and language.  Skin:  Skin is warm, dry and intact. No rash noted. Psychiatric: Mood and affect are normal. Speech and behavior are normal.  ____________________________________________   LABS (all labs ordered are listed, but only abnormal results are  displayed)  Labs Reviewed - No data to display ____________________________________________   ____________________________________________  RADIOLOGY  CT of the head and C-spine are negative X-ray of the right shoulder is negative  ____________________________________________   PROCEDURES  Procedure(s) performed: No  Procedures    ____________________________________________   INITIAL IMPRESSION / ASSESSMENT AND PLAN / ED COURSE  Pertinent labs & imaging results that were available during my care of the patient were reviewed by me and considered in my medical decision making (see chart for details).   Patient 69 year old male presents emergency department after a fall.  He arrived via EMS.  See HPI.  Physical exam shows patient appears stable.  Right shoulder is tender, C-spine mildly tender, questionable foreign body left fifth finger, remainder the exam is unremarkable  Due to the  patient's age with a fall down steps I do feel we need to CT his head.  CT of the head and C-spine ordered, x-ray of the right shoulder  CT of the head and C-spine are both negative for any acute abnormality, right shoulder x-ray is negative.  I did explain the findings to the patient.  We cleaned his finger and there is no foreign body present.  Band-Aid was applied to the finger.  Tdap was updated.  Patient was discharged in stable condition.   Joel Owens was evaluated in Emergency Department on 04/17/2020 for the symptoms described in the history of present illness. He was evaluated in the context of the global COVID-19 pandemic, which necessitated consideration that the patient might be at risk for infection with the SARS-CoV-2 virus that causes COVID-19. Institutional protocols and algorithms that pertain to the evaluation of patients at risk for COVID-19 are in a state of rapid change based on information released by regulatory bodies including the CDC and federal and state  organizations. These policies and algorithms were followed during the patient's care in the ED.   As part of my medical decision making, I reviewed the following data within the electronic MEDICAL RECORD NUMBER Nursing notes reviewed and incorporated, Old chart reviewed, Radiograph reviewed , Notes from prior ED visits and [place] Controlled Substance Database  ____________________________________________   FINAL CLINICAL IMPRESSION(S) / ED DIAGNOSES  Final diagnoses:  Fall, initial encounter  Minor head injury, initial encounter  Shoulder injury, right, initial encounter      NEW MEDICATIONS STARTED DURING THIS VISIT:  Discharge Medication List as of 04/17/2020  5:56 PM       Note:  This document was prepared using Dragon voice recognition software and may include unintentional dictation errors.    Faythe Ghee, PA-C 04/17/20 Margarite Gouge, MD 04/22/20 [phone]

## 2020-05-27 ENCOUNTER — Other Ambulatory Visit: Payer: Self-pay

## 2020-05-27 ENCOUNTER — Ambulatory Visit
Admission: EM | Admit: 2020-05-27 | Discharge: 2020-05-27 | Disposition: A | Discharge status: Home / Self Care | Payer: Medicare Other | Attending: Family Medicine | Admitting: Family Medicine

## 2020-05-27 DIAGNOSIS — J029 Acute pharyngitis, unspecified: Secondary | ICD-10-CM | POA: Diagnosis not present

## 2020-05-27 DIAGNOSIS — Z20822 Contact with and (suspected) exposure to covid-19: Secondary | ICD-10-CM | POA: Diagnosis present

## 2020-05-27 HISTORY — DX: Prediabetes: R73.03

## 2020-05-27 NOTE — ED Triage Notes (Signed)
Pt spent the day with someone on Saturday and then found out yesterday he is positive for COVID.

## 2020-05-27 NOTE — Discharge Instructions (Signed)
Results available in 24 hours.  If you develop more symptoms, please get re-tested.   Take care  Dr. Adriana Simas

## 2020-05-28 LAB — SARS CORONAVIRUS 2 (TAT 6-24 HRS): SARS Coronavirus 2: NEGATIVE

## 2020-05-28 NOTE — ED Provider Notes (Signed)
[facility]    CSN: [id number] Arrival date & time: 05/27/20  1734  History   Chief Complaint Chief Complaint  Patient presents with  . Covid Exposure   HPI  69 year old male presents with the above complaint.  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  This occurred on Saturday.  Patient states that the sick contact has not been tested yet.  Patient reports that he has a scratchy throat and mild chest tightness.  He is concerned about the possibility of COVID-19.  No fever.  No other associated symptoms.  No other complaints at this time.   Past Medical History:  Diagnosis Date  . Borderline diabetes   . GERD (gastroesophageal reflux disease)   . Heart murmur    as child   no problems  . Hypertension   . Shortness of breath dyspnea    with exertion   . Sleep apnea    never got cpap     Patient Active Problem List   Diagnosis Date Noted  . GERD (gastroesophageal reflux disease) 12/30/2016  . BPH (benign prostatic hyperplasia) 12/30/2016  . Hypertension 12/29/2016  . Severe major depression, single episode, without psychotic features ([facility]) 12/29/2016  . Type 2 diabetes mellitus without complication, without long-term current use of insulin ([facility]) 02/25/2016  . Herniated cervical intervertebral disc 07/30/2015  . Primary osteoarthritis of left knee 06/13/2014  . Back pain 07/17/2013    Past Surgical History:  Procedure Laterality Date  . ANTERIOR CERVICAL DECOMP/DISCECTOMY FUSION N/A 07/30/2015   Procedure: C4-5 C5-6 C6-7 Anterior cervical decompression/diskectomy/fusion;  Surgeon: Maeola Harman, MD;  Location: [facility];  Service: Neurosurgery;  Laterality: N/A;  C4-5 C5-6 C6-7 Anterior cervical decompression/diskectomy/fusion  . BACK SURGERY     1980s  . KNEE ARTHROPLASTY     bilat   . ROTATOR CUFF REPAIR     left   . TONSILLECTOMY         Home Medications    Prior to Admission medications   Medication Sig  Start Date End Date Taking? Authorizing Provider  amLODipine (NORVASC) 10 MG tablet Take 10 mg by mouth daily.    [provider]  esomeprazole (NEXIUM) 40 MG capsule Take 40 mg by mouth daily at 12 noon.    [provider]  hydrochlorothiazide (HYDRODIURIL) 25 MG tablet Take 25 mg by mouth daily.    [provider]  losartan (COZAAR) 100 MG tablet Take 100 mg by mouth daily.     [provider]  FLUoxetine (PROZAC) 20 MG capsule Take 1 capsule (20 mg total) by mouth daily. 01/05/17 05/27/20  Jimmy Footman, MD  metFORMIN (GLUCOPHAGE) 500 MG tablet Take 1 tablet (500 mg total) by mouth 2 (two) times daily with a meal. 01/04/17 05/27/20  Jimmy Footman, MD  traZODone (DESYREL) 150 MG tablet Take 1 tablet (150 mg total) by mouth at bedtime. 01/04/17 05/27/20  Jimmy Footman, MD    Family History Family History  Adopted: Yes    Social History Social History   Tobacco Use  . Smoking status: Former Smoker    Years: 5.00  . Smokeless tobacco: Never Used  Vaping Use  . Vaping Use: Never used  Substance Use Topics  . Alcohol use: No  . Drug use: No     Allergies   Ace inhibitors   Review of Systems Review of Systems  Constitutional: Negative for fever.  HENT: Positive for sore throat.   Respiratory:  Positive for chest tightness.    Physical Exam Triage Vital Signs ED Triage Vitals  Enc Vitals Group     BP 05/27/20 1813 129/77     Pulse Rate 05/27/20 1813 79     Resp 05/27/20 1813 18     Temp 05/27/20 1813 98.4 F (36.9 C)     Temp Source 05/27/20 1813 Oral     SpO2 05/27/20 1813 98 %     Weight 05/27/20 1811 270 lb (122.5 kg)     Height 05/27/20 1811 5' 11.5" (1.816 m)     Head Circumference --      Peak Flow --      Pain Score 05/27/20 1811 0     Pain Loc --      Pain Edu? --      Excl. in GC? --    Updated Vital Signs BP 129/77 (BP Location: Left Arm)   Pulse 79   Temp 98.4 F (36.9 C) (Oral)    Resp 18   Ht 5' 11.5" (1.816 m)   Wt 122.5 kg   SpO2 98%   BMI 37.13 kg/m   Visual Acuity Right Eye Distance:   Left Eye Distance:   Bilateral Distance:    Right Eye Near:   Left Eye Near:    Bilateral Near:     Physical Exam Vitals and nursing note reviewed.  Constitutional:      General: He is not in acute distress.    Appearance: Normal appearance. He is not ill-appearing.  HENT:     Head: Normocephalic and atraumatic.     Mouth/Throat:     Pharynx: Oropharynx is clear. No oropharyngeal exudate or posterior oropharyngeal erythema.  Eyes:     General:        Right eye: No discharge.        Left eye: No discharge.     Conjunctiva/sclera: Conjunctivae normal.  Cardiovascular:     Rate and Rhythm: Normal rate and regular rhythm.  Pulmonary:     Effort: Pulmonary effort is normal.     Breath sounds: Normal breath sounds. No wheezing or rales.  Neurological:     Mental Status: He is alert.  Psychiatric:        Mood and Affect: Mood normal.        Behavior: Behavior normal.    UC Treatments / Results  Labs (all labs ordered are listed, but only abnormal results are displayed) Labs Reviewed  SARS CORONAVIRUS 2 (TAT 6-24 HRS)    EKG   Radiology No results found.  Procedures Procedures (including critical care time)  Medications Ordered in UC Medications - No data to display  Initial Impression / Assessment and Plan / UC Course  I have reviewed the triage vital signs and the nursing notes.  Pertinent labs & imaging results that were available during my care of the patient were reviewed by me and considered in my medical decision making (see chart for details).    69 year old male presents with sore throat, chest tightness in the setting of recent exposure to a sick individual.  Awaiting Covid test results.  Supportive care.  Final Clinical Impressions(s) / UC Diagnoses   Final diagnoses:  Encounter for laboratory testing for COVID-19 virus  Sore  throat     Discharge Instructions     Results available in 24 hours.  If you develop more symptoms, please get re-tested.   Take care  Dr. Adriana Simas    ED Prescriptions  None     PDMP not reviewed this encounter.   Tommie Sams, [place] 05/28/20 1125

## 2021-06-20 ENCOUNTER — Other Ambulatory Visit: Payer: Self-pay

## 2021-06-20 ENCOUNTER — Ambulatory Visit
Admission: EM | Admit: 2021-06-20 | Discharge: 2021-06-20 | Disposition: A | Discharge status: Home / Self Care | Payer: Medicare Other

## 2021-08-05 ENCOUNTER — Other Ambulatory Visit: Payer: Self-pay | Admitting: Physical Medicine & Rehabilitation

## 2021-08-05 DIAGNOSIS — M542 Cervicalgia: Secondary | ICD-10-CM

## 2021-08-14 ENCOUNTER — Ambulatory Visit
Admission: RE | Admit: 2021-08-14 | Discharge: 2021-08-14 | Disposition: A | Discharge status: Home / Self Care | Payer: Medicare Other | Source: Ambulatory Visit | Attending: Physical Medicine & Rehabilitation | Admitting: Physical Medicine & Rehabilitation

## 2021-08-14 ENCOUNTER — Other Ambulatory Visit: Payer: Self-pay

## 2021-08-14 DIAGNOSIS — M542 Cervicalgia: Secondary | ICD-10-CM | POA: Insufficient documentation

## 2021-10-03 ENCOUNTER — Other Ambulatory Visit: Payer: Self-pay | Admitting: Neurosurgery

## 2021-10-03 DIAGNOSIS — M48062 Spinal stenosis, lumbar region with neurogenic claudication: Secondary | ICD-10-CM

## 2021-10-21 ENCOUNTER — Ambulatory Visit
Admission: RE | Admit: 2021-10-21 | Discharge: 2021-10-21 | Disposition: A | Discharge status: Home / Self Care | Payer: Medicare Other | Source: Ambulatory Visit | Attending: Neurosurgery | Admitting: Neurosurgery

## 2021-10-21 ENCOUNTER — Other Ambulatory Visit: Payer: Self-pay

## 2021-10-21 DIAGNOSIS — M48062 Spinal stenosis, lumbar region with neurogenic claudication: Secondary | ICD-10-CM | POA: Diagnosis present

## 2022-03-19 ENCOUNTER — Encounter: Payer: Self-pay | Admitting: Ophthalmology

## 2022-03-23 NOTE — Discharge Instructions (Signed)

## 2022-03-25 ENCOUNTER — Ambulatory Visit: Payer: Medicare Other | Admitting: Anesthesiology

## 2022-03-25 ENCOUNTER — Encounter: Payer: Self-pay | Admitting: Ophthalmology

## 2022-03-25 ENCOUNTER — Other Ambulatory Visit: Payer: Self-pay

## 2022-03-25 ENCOUNTER — Ambulatory Visit
Admission: RE | Admit: 2022-03-25 | Discharge: 2022-03-25 | Disposition: A | Discharge status: Home / Self Care | Payer: Medicare Other | Source: Ambulatory Visit | Attending: Ophthalmology | Admitting: Ophthalmology

## 2022-03-25 ENCOUNTER — Encounter
Admission: RE | Disposition: A | Discharge status: Home / Self Care | Payer: Self-pay | Source: Ambulatory Visit | Attending: Ophthalmology

## 2022-03-25 DIAGNOSIS — G4730 Sleep apnea, unspecified: Secondary | ICD-10-CM | POA: Insufficient documentation

## 2022-03-25 DIAGNOSIS — E119 Type 2 diabetes mellitus without complications: Secondary | ICD-10-CM

## 2022-03-25 DIAGNOSIS — Z87891 Personal history of nicotine dependence: Secondary | ICD-10-CM | POA: Insufficient documentation

## 2022-03-25 DIAGNOSIS — E1136 Type 2 diabetes mellitus with diabetic cataract: Secondary | ICD-10-CM | POA: Insufficient documentation

## 2022-03-25 DIAGNOSIS — Z7984 Long term (current) use of oral hypoglycemic drugs: Secondary | ICD-10-CM | POA: Diagnosis not present

## 2022-03-25 DIAGNOSIS — Z79899 Other long term (current) drug therapy: Secondary | ICD-10-CM | POA: Insufficient documentation

## 2022-03-25 DIAGNOSIS — I10 Essential (primary) hypertension: Secondary | ICD-10-CM | POA: Insufficient documentation

## 2022-03-25 DIAGNOSIS — K219 Gastro-esophageal reflux disease without esophagitis: Secondary | ICD-10-CM | POA: Diagnosis not present

## 2022-03-25 DIAGNOSIS — H2511 Age-related nuclear cataract, right eye: Secondary | ICD-10-CM | POA: Diagnosis not present

## 2022-03-25 HISTORY — PX: CATARACT EXTRACTION W/PHACO: SHX586

## 2022-03-25 HISTORY — DX: Other specified health status: Z78.9

## 2022-03-25 HISTORY — DX: Encounter for adoption services: Z02.82

## 2022-03-25 HISTORY — DX: Unspecified osteoarthritis, unspecified site: M19.90

## 2022-03-25 LAB — GLUCOSE, CAPILLARY
Glucose-Capillary: 117 mg/dL — ABNORMAL HIGH (ref 70–99)
Glucose-Capillary: 110 mg/dL — ABNORMAL HIGH (ref 70–99)

## 2022-03-25 SURGERY — PHACOEMULSIFICATION, CATARACT, WITH IOL INSERTION
Anesthesia: Monitor Anesthesia Care | Site: Eye | Laterality: Right

## 2022-03-25 MED ORDER — LACTATED RINGERS IV SOLN: INTRAVENOUS | Status: DC

## 2022-03-25 MED ORDER — ARMC OPHTHALMIC DILATING DROPS
1.0000 "application " | OPHTHALMIC | Status: DC | PRN
  Administered 2022-03-25 (×3): 1 via OPHTHALMIC

## 2022-03-25 MED ORDER — BRIMONIDINE TARTRATE-TIMOLOL 0.2-0.5 % OP SOLN
OPHTHALMIC | Status: DC | PRN
  Administered 2022-03-25: 1 [drp] via OPHTHALMIC

## 2022-03-25 MED ORDER — ACETAMINOPHEN 325 MG PO TABS: 650.0000 mg | ORAL_TABLET | ORAL | Status: DC | PRN

## 2022-03-25 MED ORDER — SIGHTPATH DOSE#1 BSS IO SOLN
INTRAOCULAR | Status: DC | PRN
  Administered 2022-03-25: 15 mL

## 2022-03-25 MED ORDER — MIDAZOLAM HCL 2 MG/2ML IJ SOLN
INTRAMUSCULAR | Status: DC | PRN
  Administered 2022-03-25: 2 mg via INTRAVENOUS

## 2022-03-25 MED ORDER — CEFUROXIME OPHTHALMIC INJECTION 1 MG/0.1 ML
INJECTION | OPHTHALMIC | Status: DC | PRN
  Administered 2022-03-25: 0.1 mL via INTRACAMERAL

## 2022-03-25 MED ORDER — SIGHTPATH DOSE#1 NA HYALUR & NA CHOND-NA HYALUR IO KIT
PACK | INTRAOCULAR | Status: DC | PRN
  Administered 2022-03-25: 1 via OPHTHALMIC

## 2022-03-25 MED ORDER — TETRACAINE HCL 0.5 % OP SOLN
1.0000 [drp] | OPHTHALMIC | Status: DC | PRN
Start: 2022-03-25 — End: 2022-03-25
  Administered 2022-03-25 (×3): 1 [drp] via OPHTHALMIC

## 2022-03-25 MED ORDER — SIGHTPATH DOSE#1 BSS IO SOLN
INTRAOCULAR | Status: DC | PRN
  Administered 2022-03-25: 1 mL via INTRAMUSCULAR

## 2022-03-25 MED ORDER — FENTANYL CITRATE (PF) 100 MCG/2ML IJ SOLN
INTRAMUSCULAR | Status: DC | PRN
  Administered 2022-03-25 (×2): 50 ug via INTRAVENOUS

## 2022-03-25 MED ORDER — ACETAMINOPHEN 160 MG/5ML PO SOLN: 325.0000 mg | ORAL | Status: DC | PRN

## 2022-03-25 MED ORDER — ONDANSETRON HCL 4 MG/2ML IJ SOLN: 4.0000 mg | Freq: Once | INTRAMUSCULAR | Status: DC | PRN

## 2022-03-25 MED ORDER — SIGHTPATH DOSE#1 BSS IO SOLN
INTRAOCULAR | Status: DC | PRN
  Administered 2022-03-25: 69 mL via OPHTHALMIC

## 2022-03-25 SURGICAL SUPPLY — 11 items
CATARACT SUITE SIGHTPATH (MISCELLANEOUS) ×2 IMPLANT
FEE CATARACT SUITE SIGHTPATH (MISCELLANEOUS) ×1 IMPLANT
GLOVE SRG 8 PF TXTR STRL LF DI (GLOVE) ×1 IMPLANT
GLOVE SURG ENC TEXT LTX SZ7.5 (GLOVE) ×2 IMPLANT
GLOVE SURG UNDER POLY LF SZ8 (GLOVE) ×2
LENS IOL TECNIS EYHANCE 16.5 (Intraocular Lens) ×1 IMPLANT
NDL FILTER BLUNT 18X1 1/2 (NEEDLE) ×1 IMPLANT
NEEDLE FILTER BLUNT 18X 1/2SAF (NEEDLE) ×1
NEEDLE FILTER BLUNT 18X1 1/2 (NEEDLE) ×1 IMPLANT
SYR 3ML LL SCALE MARK (SYRINGE) ×2 IMPLANT
WATER STERILE IRR 250ML POUR (IV SOLUTION) ×2 IMPLANT

## 2022-03-25 NOTE — H&P (Signed)
[facility]   Primary Care Physician:  Rayetta Humphrey, MD Ophthalmologist: Dr. Lockie Mola  Pre-Procedure History & Physical: HPI:  Joel Owens is a 71 y.o. male here for ophthalmic surgery.   Past Medical History:  Diagnosis Date   Adopted    Arthritis    Borderline diabetes    GERD (gastroesophageal reflux disease)    Heart murmur    as child   no problems   Hypertension    Shortness of breath dyspnea    with exertion    Sleep apnea    never got cpap     Past Surgical History:  Procedure Laterality Date   ANTERIOR CERVICAL DECOMP/DISCECTOMY FUSION N/A 07/30/2015   Procedure: C4-5 C5-6 C6-7 Anterior cervical decompression/diskectomy/fusion;  Surgeon: Maeola Harman, MD;  Location: [facility];  Service: Neurosurgery;  Laterality: N/A;  C4-5 C5-6 C6-7 Anterior cervical decompression/diskectomy/fusion   BACK SURGERY     1980s   KNEE ARTHROPLASTY     bilat    ROTATOR CUFF REPAIR     left    TONSILLECTOMY      Prior to Admission medications   Medication Sig Start Date End Date Taking? Authorizing Provider  amLODipine (NORVASC) 10 MG tablet Take 10 mg by mouth daily.   Yes [provider]  Cholecalciferol (VITAMIN D3) 125 MCG (5000 UT) TABS Take by mouth.   Yes [provider]  esomeprazole (NEXIUM) 40 MG capsule Take 20 mg by mouth daily at 12 noon.   Yes [provider]  gabapentin (NEURONTIN) 300 MG capsule Take 300 mg by mouth 2 (two) times daily.   Yes [provider]  glipiZIDE (GLUCOTROL XL) 2.5 MG 24 hr tablet Take 2.5 mg by mouth daily with breakfast.   Yes [provider]  hydrALAZINE (APRESOLINE) 50 MG tablet Take 50 mg by mouth.   Yes [provider]  hydrochlorothiazide (HYDRODIURIL) 25 MG tablet Take 25 mg by mouth daily.   Yes [provider]  losartan (COZAAR) 100 MG tablet Take 100 mg by mouth daily.    Yes [provider]  Multiple Vitamins-Minerals (CENTRUM  SILVER 50+MEN) TABS Take by mouth.   Yes [provider]  omeprazole (PRILOSEC) 20 MG capsule Take 20 mg by mouth daily.   Yes [provider]  tamsulosin (FLOMAX) 0.4 MG CAPS capsule Take 0.4 mg by mouth.   Yes [provider]  Vitamin E 670 MG (1000 UT) CAPS Take by mouth.   Yes [provider]  FLUoxetine (PROZAC) 20 MG capsule Take 1 capsule (20 mg total) by mouth daily. 01/05/17 05/27/20  Jimmy Footman, MD  metFORMIN (GLUCOPHAGE) 500 MG tablet Take 1 tablet (500 mg total) by mouth 2 (two) times daily with a meal. 01/04/17 05/27/20  Jimmy Footman, MD  traZODone (DESYREL) 150 MG tablet Take 1 tablet (150 mg total) by mouth at bedtime. 01/04/17 05/27/20  Jimmy Footman, MD    Allergies as of 01/30/2022 - Review Complete 05/27/2020  Allergen Reaction Noted   Ace inhibitors Cough 07/23/2015    Family History  Adopted: Yes    Social History   Socioeconomic History   Marital status: Married    Spouse name: Not on file   Number of children: Not on file   Years of education: Not on file   Highest education level: Not on file  Occupational History   Not on file  Tobacco Use   Smoking status: Former    Years: 5.00  Types: Cigarettes    Quit date: 41    Years since quitting: 47.4   Smokeless tobacco: Never  Vaping Use   Vaping Use: Never used  Substance and Sexual Activity   Alcohol use: No   Drug use: No   Sexual activity: Never  Other Topics Concern   Not on file  Social History Narrative   Not on file   Social Determinants of Health   Financial Resource Strain: Not on file  Food Insecurity: Not on file  Transportation Needs: Not on file  Physical Activity: Not on file  Stress: Not on file  Social Connections: Not on file  Intimate Partner Violence: Not on file    Review of Systems: See HPI, otherwise negative ROS  Physical Exam: BP (!) 133/59   Pulse 78   Temp 97.6 F (36.4 C)   Ht 5\' 11"   (1.803 m)   Wt 123.4 kg   SpO2 95%   BMI 37.94 kg/m  General:   Alert,  pleasant and cooperative in NAD Head:  Normocephalic and atraumatic. Lungs:  Clear to auscultation.    Heart:  Regular rate and rhythm.   Impression/Plan: is here for ophthalmic surgery.  Risks, benefits, limitations, and alternatives regarding ophthalmic surgery have been reviewed with the patient.  Questions have been answered.  All parties agreeable.   Vernie Murders, MD  03/25/2022, 10:13 AM

## 2022-03-25 NOTE — Op Note (Signed)
LOCATION:  [facility]   PREOPERATIVE DIAGNOSIS:    Nuclear sclerotic cataract right eye. H25.11   POSTOPERATIVE DIAGNOSIS:  Nuclear sclerotic cataract right eye.     PROCEDURE:  Phacoemusification with posterior chamber intraocular lens placement of the right eye   ULTRASOUND TIME: Procedure(s): CATARACT EXTRACTION PHACO AND INTRAOCULAR LENS PLACEMENT (IOC) RIGHT 8.21 01:22.7 (Right)  LENS:   Implant Name Type Inv. Item Serial No. Manufacturer Lot No. LRB No. Used Action  LENS IOL TECNIS EYHANCE 16.5 - [id number] Intraocular Lens LENS IOL TECNIS EYHANCE 16.5 [id number] [employer]  Right 1 Implanted         SURGEON:  Deirdre Evener, MD   ANESTHESIA:  Topical with tetracaine drops and 2% Xylocaine jelly, augmented with 1% preservative-free intracameral lidocaine.    COMPLICATIONS:  None.   DESCRIPTION OF PROCEDURE:  The patient was identified in the holding room and transported to the operating room and placed in the supine position under the operating microscope.  The right eye was identified as the operative eye and it was prepped and draped in the usual sterile ophthalmic fashion.   A 1 millimeter clear-corneal paracentesis was made at the 12:00 position.  0.5 ml of preservative-free 1% lidocaine was injected into the anterior chamber. The anterior chamber was filled with Viscoat viscoelastic.  A 2.4 millimeter keratome was used to make a near-clear corneal incision at the 9:00 position.  A curvilinear capsulorrhexis was made with a cystotome and capsulorrhexis forceps.  Balanced salt solution was used to hydrodissect and hydrodelineate the nucleus.   Phacoemulsification was then used in stop and chop fashion to remove the lens nucleus and epinucleus.  The remaining cortex was then removed using the irrigation and aspiration handpiece. Provisc was then placed into the capsular bag to distend it for lens placement.  A lens was then injected into the capsular bag.  The  remaining viscoelastic was aspirated.   Wounds were hydrated with balanced salt solution.  The anterior chamber was inflated to a physiologic pressure with balanced salt solution.  No wound leaks were noted. Cefuroxime 0.1 ml of a 10mg /ml solution was injected into the anterior chamber for a dose of 1 mg of intracameral antibiotic at the completion of the case.   Timolol and Brimonidine drops were applied to the eye.  The patient was taken to the recovery room in stable condition without complications of anesthesia or surgery.   Lasaundra Riche 03/25/2022, 11:31 AM

## 2022-03-25 NOTE — Transfer of Care (Signed)
Immediate Anesthesia Transfer of Care Note  Patient: Joel Owens  Procedure(s) Performed: CATARACT EXTRACTION PHACO AND INTRAOCULAR LENS PLACEMENT (IOC) RIGHT 8.21 01:22.7 (Right: Eye)  Patient Location: PACU  Anesthesia Type: MAC  Level of Consciousness: awake, alert  and patient cooperative  Airway and Oxygen Therapy: Patient Spontanous Breathing and Patient connected to supplemental oxygen  Post-op Assessment: Post-op Vital signs reviewed, Patient's Cardiovascular Status Stable, Respiratory Function Stable, Patent Airway and No signs of Nausea or vomiting  Post-op Vital Signs: Reviewed and stable  Complications: No notable events documented.

## 2022-03-25 NOTE — Anesthesia Postprocedure Evaluation (Signed)
Anesthesia Post Note  Patient: Joel Owens  Procedure(s) Performed: CATARACT EXTRACTION PHACO AND INTRAOCULAR LENS PLACEMENT (IOC) RIGHT 8.21 01:22.7 (Right: Eye)     Patient location during evaluation: PACU Anesthesia Type: MAC Level of consciousness: awake and alert Pain management: pain level controlled Vital Signs Assessment: post-procedure vital signs reviewed and stable Respiratory status: spontaneous breathing Cardiovascular status: blood pressure returned to baseline Postop Assessment: no apparent nausea or vomiting, adequate PO intake and no headache Anesthetic complications: no   No notable events documented.  Adele Barthel Maggie Dworkin

## 2022-03-25 NOTE — Anesthesia Preprocedure Evaluation (Addendum)
Anesthesia Evaluation  Patient identified by MRN, date of birth, ID band Patient awake    History of Anesthesia Complications Negative for: history of anesthetic complications  Airway Mallampati: IV  TM Distance: >3 FB   Mouth opening: Limited Mouth Opening  Dental no notable dental hx.    Pulmonary sleep apnea , former smoker,    Pulmonary exam normal        Cardiovascular Exercise Tolerance: Good hypertension, Pt. on medications Normal cardiovascular exam     Neuro/Psych negative neurological ROS     GI/Hepatic GERD  Medicated and Controlled,  Endo/Other  diabetes, Type 2  Renal/GU      Musculoskeletal   Abdominal   Peds  Hematology negative hematology ROS (+)   Anesthesia Other Findings   Reproductive/Obstetrics                            Anesthesia Physical Anesthesia Plan  ASA: 2  Anesthesia Plan: MAC   Post-op Pain Management: Minimal or no pain anticipated   Induction:   PONV Risk Score and Plan: 1 and TIVA, Midazolam and Treatment may vary due to age or medical condition  Airway Management Planned: Nasal Cannula and Natural Airway  Additional Equipment: None  Intra-op Plan:   Post-operative Plan:   Informed Consent: I have reviewed the patients History and Physical, chart, labs and discussed the procedure including the risks, benefits and alternatives for the proposed anesthesia with the patient or authorized representative who has indicated his/her understanding and acceptance.       Plan Discussed with:   Anesthesia Plan Comments:         Anesthesia Quick Evaluation

## 2022-03-26 ENCOUNTER — Encounter: Payer: Self-pay | Admitting: Ophthalmology

## 2022-03-26 NOTE — Addendum Note (Signed)
Addendum  created 03/26/22 0639 by Rasean Joos M, RN   Intraprocedure Event deleted    

## 2022-06-01 IMAGING — CR DG SHOULDER 2+V*R*
1 series · 3 of 3 positions shown · non-contrast
Comparison: None.

CLINICAL DATA: Pain status post fall

EXAM:
RIGHT SHOULDER - 2+ VIEW

[Series 1: dg shoulder right · 0.14mm/px · 3 of 3 slices shown]
[im 1/3]
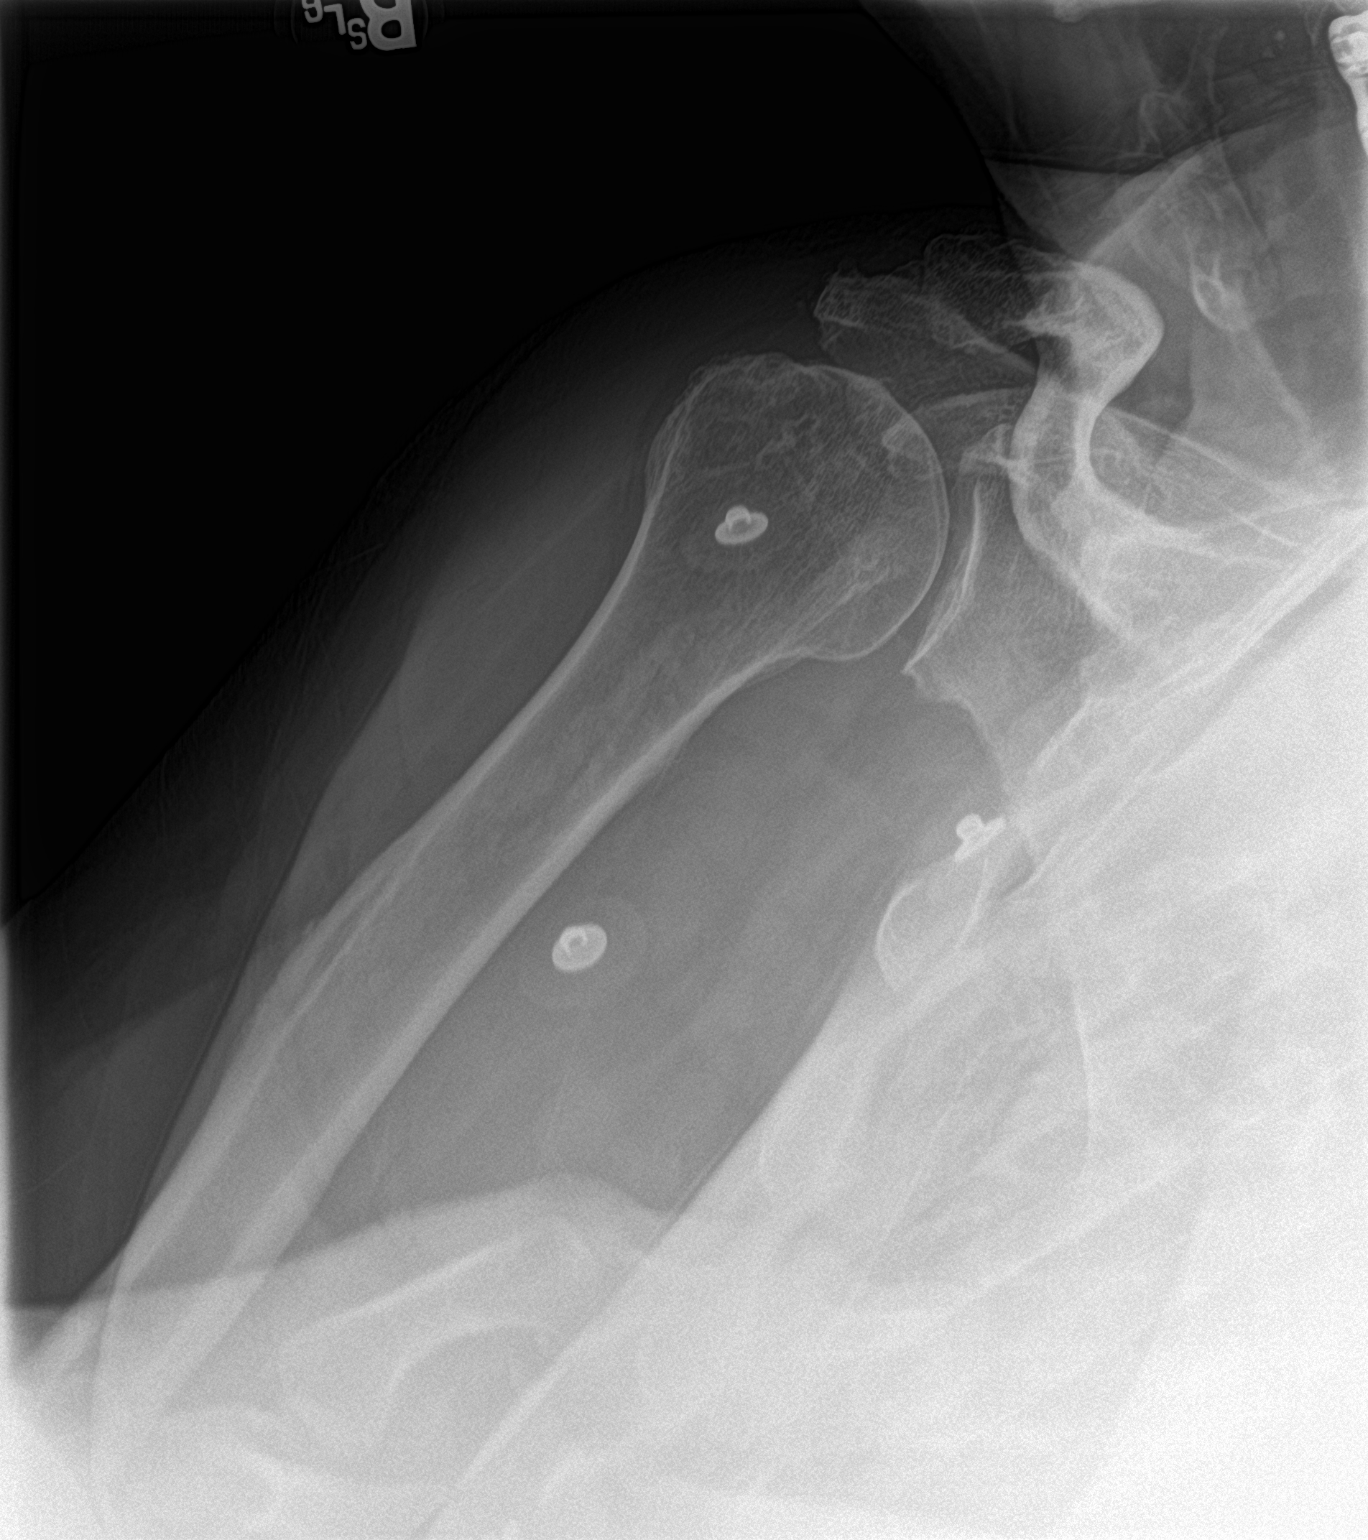
[im 2/3]
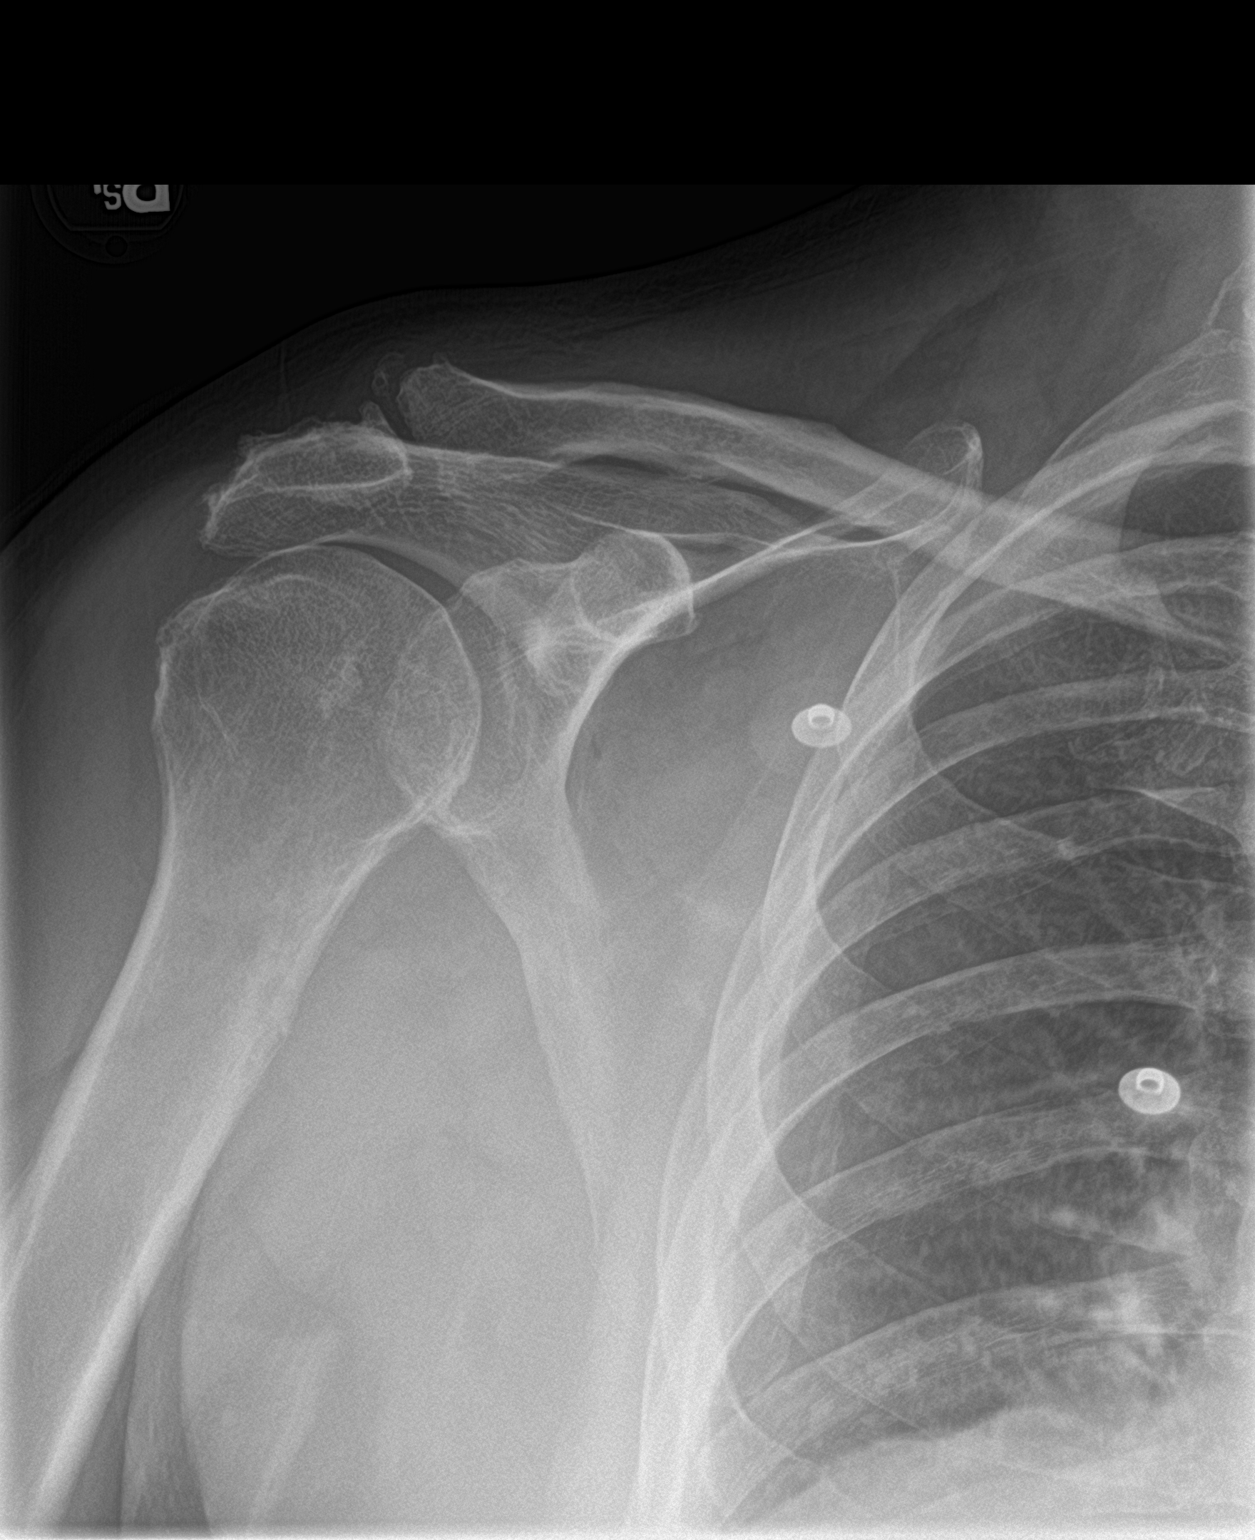
[im 3/3]
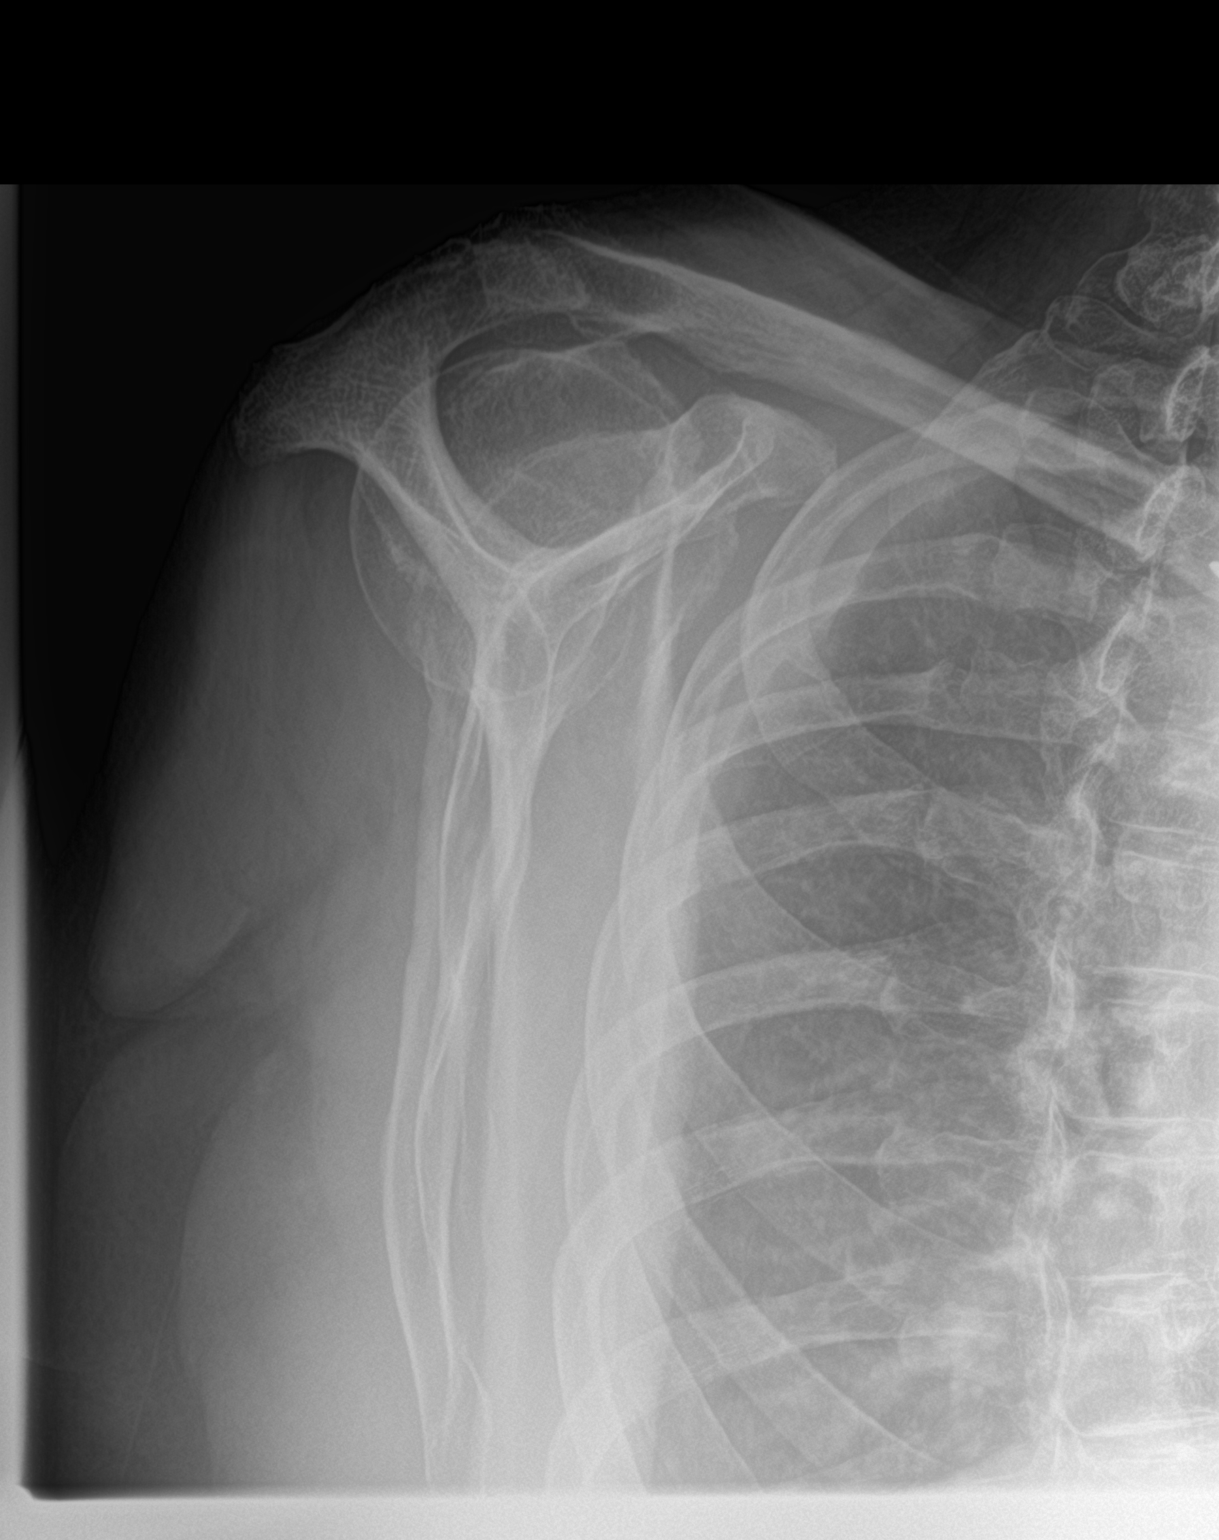

[3 of 3 positions shown; findings below may reference images not displayed]

FINDINGS: There is mild osteoarthritis of the right glenohumeral joint. There
is no acute displaced fracture or dislocation. The osseous
mineralization is within normal limits.
IMPRESSION: Mild osteoarthritis of the right glenohumeral joint. No acute
fracture or dislocation.

## 2023-03-11 ENCOUNTER — Encounter: Payer: Self-pay | Admitting: Gastroenterology

## 2023-03-11 NOTE — H&P (Signed)
Pre-Procedure H&P   Patient ID: Joel Owens is a 72 y.o. male.  Gastroenterology Provider: Jaynie Collins, DO  Referring Provider:  Dr. Greggory Stallion PCP: Rayetta Humphrey, MD  Date: 03/12/2023  HPI Mr. Joel Owens is a 72 y.o. male who presents today for Colonoscopy for Colorectal cancer screening .  Last underwent colonoscopy in 2012 with diverticulosis and otherwise normal.  No personal history of polyps.  Unknown family history.  Reports 1-2 bowel movements daily without melena or hematochezia  Patient FIT positive in December 2023  Hemoglobin 14.3 MCV 90 platelets 322,000 creatinine 1.4   Past Medical History:  Diagnosis Date   Adopted    Arthritis    Borderline diabetes    GERD (gastroesophageal reflux disease)    Heart murmur    as child   no problems   Hypertension    Pre-diabetes    Shortness of breath dyspnea    with exertion    Sleep apnea    never got cpap     Past Surgical History:  Procedure Laterality Date   ANTERIOR CERVICAL DECOMP/DISCECTOMY FUSION N/A 07/30/2015   Procedure: C4-5 C5-6 C6-7 Anterior cervical decompression/diskectomy/fusion;  Surgeon: Maeola Harman, MD;  Location: [facility];  Service: Neurosurgery;  Laterality: N/A;  C4-5 C5-6 C6-7 Anterior cervical decompression/diskectomy/fusion   BACK SURGERY     1980s   CATARACT EXTRACTION W/PHACO Right 03/25/2022   Procedure: CATARACT EXTRACTION PHACO AND INTRAOCULAR LENS PLACEMENT (IOC) RIGHT 8.21 01:22.7;  Surgeon: Lockie Mola, MD;  Location: [facility];  Service: Ophthalmology;  Laterality: Right;   EYE SURGERY     KNEE ARTHROPLASTY     bilat    ROTATOR CUFF REPAIR     left    TONSILLECTOMY      Family History Unknown.  Patient is adopted  Review of Systems  Constitutional:  Negative for activity change, appetite change, chills, diaphoresis, fatigue, fever and unexpected weight change.  HENT:  Negative for trouble swallowing and voice change.    Respiratory:  Negative for shortness of breath and wheezing.   Cardiovascular:  Negative for chest pain, palpitations and leg swelling.  Gastrointestinal:  Negative for abdominal distention, abdominal pain, anal bleeding, blood in stool, constipation, diarrhea, nausea and vomiting.  Musculoskeletal:  Negative for arthralgias and myalgias.  Skin:  Negative for color change and pallor.  Neurological:  Negative for dizziness, syncope and weakness.  Psychiatric/Behavioral:  Negative for confusion. The patient is not nervous/anxious.   All other systems reviewed and are negative.    Medications No current facility-administered medications on file prior to encounter.   Current Outpatient Medications on File Prior to Encounter  Medication Sig Dispense Refill   amLODipine (NORVASC) 10 MG tablet Take 10 mg by mouth daily.     Cholecalciferol (VITAMIN D3) 125 MCG (5000 UT) TABS Take by mouth.     esomeprazole (NEXIUM) 40 MG capsule Take 20 mg by mouth daily at 12 noon.     gabapentin (NEURONTIN) 300 MG capsule Take 300 mg by mouth 2 (two) times daily.     glipiZIDE (GLUCOTROL XL) 2.5 MG 24 hr tablet Take 2.5 mg by mouth daily with breakfast.     hydrALAZINE (APRESOLINE) 50 MG tablet Take 50 mg by mouth.     hydrochlorothiazide (HYDRODIURIL) 25 MG tablet Take 25 mg by mouth daily.     losartan (COZAAR) 100 MG tablet Take 100 mg by mouth daily.      Multiple Vitamins-Minerals (CENTRUM SILVER 50+MEN)  TABS Take by mouth.     omeprazole (PRILOSEC) 20 MG capsule Take 20 mg by mouth daily.     tamsulosin (FLOMAX) 0.4 MG CAPS capsule Take 0.4 mg by mouth.     Vitamin E 670 MG (1000 UT) CAPS Take by mouth.     [DISCONTINUED] FLUoxetine (PROZAC) 20 MG capsule Take 1 capsule (20 mg total) by mouth daily. 30 capsule 0   [DISCONTINUED] metFORMIN (GLUCOPHAGE) 500 MG tablet Take 1 tablet (500 mg total) by mouth 2 (two) times daily with a meal. 60 tablet 0   [DISCONTINUED] traZODone (DESYREL) 150 MG tablet Take  1 tablet (150 mg total) by mouth at bedtime. 30 tablet 0    Pertinent medications related to GI and procedure were reviewed by me with the patient prior to the procedure   Current Facility-Administered Medications:    0.9 %  sodium chloride infusion, , Intravenous, Continuous, Jaynie Collins, DO  sodium chloride         Allergies  Allergen Reactions   Ace Inhibitors Cough   Allergies were reviewed by me prior to the procedure  Objective   Body mass index is 41.14 kg/m. Vitals:   03/12/23 0849  BP: (!) 174/93  Pulse: 76  Resp: 20  Temp: (!) 97.1 F (36.2 C)  TempSrc: Temporal  SpO2: 97%  Weight: 133.8 kg  Height: 5\' 11"  (1.803 m)     Physical Exam Vitals and nursing note reviewed.  Constitutional:      General: He is not in acute distress.    Appearance: Normal appearance. He is obese. He is not ill-appearing, toxic-appearing or diaphoretic.  HENT:     Head: Normocephalic and atraumatic.     Nose: Nose normal.     Mouth/Throat:     Mouth: Mucous membranes are moist.     Pharynx: Oropharynx is clear.  Eyes:     General: No scleral icterus.    Extraocular Movements: Extraocular movements intact.  Cardiovascular:     Rate and Rhythm: Normal rate and regular rhythm.     Heart sounds: Normal heart sounds. No murmur heard.    No friction rub. No gallop.  Pulmonary:     Effort: Pulmonary effort is normal. No respiratory distress.     Breath sounds: Normal breath sounds. No wheezing, rhonchi or rales.  Abdominal:     General: Bowel sounds are normal. There is no distension.     Palpations: Abdomen is soft.     Tenderness: There is no abdominal tenderness. There is no guarding or rebound.  Musculoskeletal:     Cervical back: Neck supple.     Right lower leg: No edema.     Left lower leg: No edema.  Skin:    General: Skin is warm and dry.     Coloration: Skin is not jaundiced or pale.  Neurological:     General: No focal deficit present.     Mental  Status: He is alert and oriented to person, place, and time. Mental status is at baseline.  Psychiatric:        Mood and Affect: Mood normal.        Behavior: Behavior normal.        Thought Content: Thought content normal.        Judgment: Judgment normal.      Assessment:  Joel Owens is a 72 y.o. male  who presents today for Colonoscopy for Colorectal cancer screening .  Plan:  Colonoscopy with possible  intervention today  Colonoscopy with possible biopsy, control of bleeding, polypectomy, and interventions as necessary has been discussed with the patient/patient representative. Informed consent was obtained from the patient/patient representative after explaining the indication, nature, and risks of the procedure including but not limited to death, bleeding, perforation, missed neoplasm/lesions, cardiorespiratory compromise, and reaction to medications. Opportunity for questions was given and appropriate answers were provided. Patient/patient representative has verbalized understanding is amenable to undergoing the procedure.   Jaynie Collins, DO  [facility]  Portions of the record may have been created with voice recognition software. Occasional wrong-word or 'sound-a-like' substitutions may have occurred due to the inherent limitations of voice recognition software.  Read the chart carefully and recognize, using context, where substitutions may have occurred.

## 2023-03-12 ENCOUNTER — Encounter
Admission: RE | Disposition: A | Discharge status: Home / Self Care | Payer: Self-pay | Source: Home / Self Care | Attending: Gastroenterology

## 2023-03-12 ENCOUNTER — Ambulatory Visit
Admission: RE | Admit: 2023-03-12 | Discharge: 2023-03-12 | Disposition: A | Discharge status: Home / Self Care | Payer: Medicare Other | Attending: Gastroenterology | Admitting: Gastroenterology

## 2023-03-12 ENCOUNTER — Encounter: Payer: Self-pay | Admitting: Gastroenterology

## 2023-03-12 ENCOUNTER — Ambulatory Visit: Payer: Medicare Other | Admitting: Certified Registered"

## 2023-03-12 DIAGNOSIS — D124 Benign neoplasm of descending colon: Secondary | ICD-10-CM | POA: Diagnosis not present

## 2023-03-12 DIAGNOSIS — G4730 Sleep apnea, unspecified: Secondary | ICD-10-CM | POA: Insufficient documentation

## 2023-03-12 DIAGNOSIS — I10 Essential (primary) hypertension: Secondary | ICD-10-CM | POA: Insufficient documentation

## 2023-03-12 DIAGNOSIS — K5730 Diverticulosis of large intestine without perforation or abscess without bleeding: Secondary | ICD-10-CM | POA: Insufficient documentation

## 2023-03-12 DIAGNOSIS — Z09 Encounter for follow-up examination after completed treatment for conditions other than malignant neoplasm: Secondary | ICD-10-CM | POA: Diagnosis not present

## 2023-03-12 DIAGNOSIS — Z7984 Long term (current) use of oral hypoglycemic drugs: Secondary | ICD-10-CM | POA: Insufficient documentation

## 2023-03-12 DIAGNOSIS — D123 Benign neoplasm of transverse colon: Secondary | ICD-10-CM | POA: Diagnosis not present

## 2023-03-12 DIAGNOSIS — E119 Type 2 diabetes mellitus without complications: Secondary | ICD-10-CM | POA: Insufficient documentation

## 2023-03-12 DIAGNOSIS — Z1211 Encounter for screening for malignant neoplasm of colon: Secondary | ICD-10-CM | POA: Insufficient documentation

## 2023-03-12 DIAGNOSIS — Z79899 Other long term (current) drug therapy: Secondary | ICD-10-CM | POA: Diagnosis not present

## 2023-03-12 DIAGNOSIS — D122 Benign neoplasm of ascending colon: Secondary | ICD-10-CM | POA: Diagnosis not present

## 2023-03-12 DIAGNOSIS — K219 Gastro-esophageal reflux disease without esophagitis: Secondary | ICD-10-CM | POA: Insufficient documentation

## 2023-03-12 DIAGNOSIS — Z87891 Personal history of nicotine dependence: Secondary | ICD-10-CM | POA: Diagnosis not present

## 2023-03-12 HISTORY — PX: COLONOSCOPY WITH PROPOFOL: SHX5780

## 2023-03-12 HISTORY — DX: Prediabetes: R73.03

## 2023-03-12 LAB — GLUCOSE, CAPILLARY: Glucose-Capillary: 125 mg/dL — ABNORMAL HIGH (ref 70–99)

## 2023-03-12 SURGERY — COLONOSCOPY WITH PROPOFOL
Anesthesia: General

## 2023-03-12 MED ORDER — DEXMEDETOMIDINE HCL IN NACL 80 MCG/20ML IV SOLN
INTRAVENOUS | Status: DC | PRN
  Administered 2023-03-12: 8 ug via INTRAVENOUS

## 2023-03-12 MED ORDER — STERILE WATER FOR IRRIGATION IR SOLN
Status: DC | PRN
  Administered 2023-03-12: 60 mL

## 2023-03-12 MED ORDER — PROPOFOL 10 MG/ML IV BOLUS
INTRAVENOUS | Status: AC
  Filled 2023-03-12: qty 20

## 2023-03-12 MED ORDER — SODIUM CHLORIDE 0.9 % IV SOLN: INTRAVENOUS | Status: DC

## 2023-03-12 MED ORDER — PROPOFOL 10 MG/ML IV BOLUS
INTRAVENOUS | Status: AC
  Filled 2023-03-12: qty 40

## 2023-03-12 MED ORDER — LIDOCAINE HCL (CARDIAC) PF 100 MG/5ML IV SOSY
PREFILLED_SYRINGE | INTRAVENOUS | Status: DC | PRN
  Administered 2023-03-12: 100 mg via INTRAVENOUS

## 2023-03-12 MED ORDER — PROPOFOL 10 MG/ML IV BOLUS
INTRAVENOUS | Status: DC | PRN
  Administered 2023-03-12: 100 mg via INTRAVENOUS
  Administered 2023-03-12: 90 ug/kg/min via INTRAVENOUS

## 2023-03-12 MED ORDER — LIDOCAINE HCL (PF) 2 % IJ SOLN
INTRAMUSCULAR | Status: AC
  Filled 2023-03-12: qty 5

## 2023-03-12 NOTE — Anesthesia Postprocedure Evaluation (Signed)
Anesthesia Post Note  Patient: Joel Owens  Procedure(s) Performed: COLONOSCOPY WITH PROPOFOL  Patient location during evaluation: PACU Anesthesia Type: General Level of consciousness: awake and awake and alert Pain management: satisfactory to patient Vital Signs Assessment: post-procedure vital signs reviewed and stable Respiratory status: spontaneous breathing and nonlabored ventilation Cardiovascular status: stable Anesthetic complications: no   No notable events documented.   Last Vitals:  Vitals:   03/12/23 1027 03/12/23 1030  BP:  124/80  Pulse: 73 66  Resp: (!) 21 14  Temp:    SpO2: 94% 93%    Last Pain:  Vitals:   03/12/23 1030  TempSrc:   PainSc: 0-No pain                 VAN STAVEREN,Elmer Merwin

## 2023-03-12 NOTE — Interval H&P Note (Signed)
History and Physical Interval Note: Preprocedure H&P from 03/12/23  was reviewed and there was no interval change after seeing and examining the patient.  Written consent was obtained from the patient after discussion of risks, benefits, and alternatives. Patient has consented to proceed with Colonoscopy with possible intervention   03/12/2023 9:13 AM  Joel Owens  has presented today for surgery, with the diagnosis of V76.51 (ICD-9-CM) - Z12.11 (ICD-10-CM) - Colon cancer screening.  The various methods of treatment have been discussed with the patient and family. After consideration of risks, benefits and other options for treatment, the patient has consented to  Procedure(s): COLONOSCOPY WITH PROPOFOL (N/A) as a surgical intervention.  The patient's history has been reviewed, patient examined, no change in status, stable for surgery.  I have reviewed the patient's chart and labs.  Questions were answered to the patient's satisfaction.     Jaynie Collins

## 2023-03-12 NOTE — Op Note (Signed)
[facility] Gastroenterology Patient Name: Joel Owens Procedure Date: 03/12/2023 9:03 AM MRN: [id number] Account #: [id number] Date of Birth: Dec 03, 1950 Admit Type: Outpatient Age: 72 Room: [facility] ENDO ROOM 1 Gender: Male Note Status: Supervisor Override Instrument Name: Nelda Marseille [id number] Procedure:             Colonoscopy Indications:           Screening for colorectal malignant neoplasm Providers:             Jaynie Collins DO, DO Referring MD:          Lurlean Leyden A. Greggory Stallion MD, MD (Referring MD) Medicines:             Monitored Anesthesia Care Complications:         No immediate complications. Estimated blood loss:                         Minimal. Procedure:             Pre-Anesthesia Assessment:                        - Prior to the procedure, a History and Physical was                         performed, and patient medications and allergies were                         reviewed. The patient is competent. The risks and                         benefits of the procedure and the sedation options and                         risks were discussed with the patient. All questions                         were answered and informed consent was obtained.                         Patient identification and proposed procedure were                         verified by the physician, the nurse, the anesthetist                         and the technician in the endoscopy suite. Mental                         Status Examination: alert and oriented. Airway                         Examination: normal oropharyngeal airway and neck                         mobility. Respiratory Examination: clear to                         auscultation. CV Examination: RRR, no murmurs, no S3  or S4. Prophylactic Antibiotics: The patient does not                         require prophylactic antibiotics. Prior                         Anticoagulants: The patient has taken no  anticoagulant                         or antiplatelet agents. ASA Grade Assessment: II - A                         patient with mild systemic disease. After reviewing                         the risks and benefits, the patient was deemed in                         satisfactory condition to undergo the procedure. The                         anesthesia plan was to use monitored anesthesia care                         (MAC). Immediately prior to administration of                         medications, the patient was re-assessed for adequacy                         to receive sedatives. The heart rate, respiratory                         rate, oxygen saturations, blood pressure, adequacy of                         pulmonary ventilation, and response to care were                         monitored throughout the procedure. The physical                         status of the patient was re-assessed after the                         procedure.                        After obtaining informed consent, the colonoscope was                         passed under direct vision. Throughout the procedure,                         the patient's blood pressure, pulse, and oxygen                         saturations were monitored continuously. The  Colonoscope was introduced through the anus and                         advanced to the the ascending colon. The colonoscopy                         was technically difficult and complex due to multiple                         diverticula in the colon, a redundant colon,                         significant looping and the patient's body habitus.                         Successful completion of the procedure was aided by                         changing the patient to a supine position, changing                         the patient to a prone position, straightening and                         shortening the scope to obtain bowel loop reduction,                          using scope torsion, applying abdominal pressure,                         lavage and receiving assistance from additional staff.                         The patient tolerated the procedure well. The quality                         of the bowel preparation was evaluated using the BBPS                         [facility] Bowel Preparation Scale) with scores of: Right                         Colon = 2 (minor amount of residual staining, small                         fragments of stool and/or opaque liquid, but mucosa                         seen well), Transverse Colon = 2 (minor amount of                         residual staining, small fragments of stool and/or                         opaque liquid, but mucosa seen well) and Left Colon =  2 (minor amount of residual staining, small fragments                         of stool and/or opaque liquid, but mucosa seen well).                         The total BBPS score equals 6. The quality of the                         bowel preparation was good. The rectum was                         photographed. Findings:      The perianal and digital rectal examinations were normal. Pertinent       negatives include normal sphincter tone.      Multiple small-mouthed, severe diverticula were found in the left colon.       Lavage of the area was performed using a moderate amount, resulting in       clearance with good visualization. Estimated blood loss: none.      Three sessile polyps were found in the descending colon, transverse       colon and ascending colon. The polyps were 3 to 5 mm in size. These       polyps were removed with a cold snare. Resection and retrieval were       complete. Estimated blood loss was minimal.      Two sessile polyps were found in the descending colon and ascending       colon. The polyps were 1 to 2 mm in size. These polyps were removed with       a jumbo cold forceps. Resection and  retrieval were complete. Estimated       blood loss was minimal.      Due to body habitus, redundant colon with significant looping, and       severe diverticulosis, unable to reach cecum despite lavage, multiple       position changes, abdominal pressure with additional staff, complete       scope withdrawal, scope torsion and loop reduction. Impression:            - Diverticulosis in the left colon.                        - Three 3 to 5 mm polyps in the descending colon, in                         the transverse colon and in the ascending colon,                         removed with a cold snare. Resected and retrieved.                        - Two 1 to 2 mm polyps in the descending colon and in                         the ascending colon, removed with a jumbo cold                         forceps. Resected  and retrieved. Recommendation:        - Patient has a contact number available for                         emergencies. The signs and symptoms of potential                         delayed complications were discussed with the patient.                         Return to normal activities tomorrow. Written                         discharge instructions were provided to the patient.                        - Discharge patient to home.                        - Resume previous diet.                        - Continue present medications.                        - No ibuprofen, naproxen, or other non-steroidal                         anti-inflammatory drugs for 5 days after polyp removal.                        - Repeat colonoscopy in 3 months because the                         examination was incomplete.                        - Return to referring physician as previously                         scheduled.                        - The findings and recommendations were discussed with                         the patient. Procedure Code(s):     --- Professional ---                        [phone],  52, Colonoscopy, flexible; with removal of                         tumor(s), polyp(s), or other lesion(s) by snare                         technique                        45380, 59,52, Colonoscopy, flexible; with biopsy,  single or multiple Diagnosis Code(s):     --- Professional ---                        D12.3, Benign neoplasm of transverse colon (hepatic                         flexure or splenic flexure)                        D12.4, Benign neoplasm of descending colon                        D12.2, Benign neoplasm of ascending colon                        R19.5, Other fecal abnormalities                        K57.30, Diverticulosis of large intestine without                         perforation or abscess without bleeding CPT copyright 2022 American [employer] Association. All rights reserved. The codes documented in this report are preliminary and upon coder review may  be revised to meet current compliance requirements. Attending Participation:      I personally performed the entire procedure. Elfredia Nevins, DO Jaynie Collins DO, DO 03/12/2023 10:23:27 AM This report has been signed electronically. Number of Addenda: 0 Note Initiated On: 03/12/2023 9:03 AM Scope Withdrawal Time: 0 hours 7 minutes 54 seconds  Total Procedure Duration: 0 hours 52 minutes 9 seconds  Estimated Blood Loss:  Estimated blood loss was minimal.      [facility]

## 2023-03-12 NOTE — Anesthesia Preprocedure Evaluation (Signed)
Anesthesia Evaluation  Patient identified by MRN, date of birth, ID band Patient awake    Reviewed: Allergy & Precautions, NPO status , Patient's Chart, lab work & pertinent test results  Airway Mallampati: II  TM Distance: >3 FB Neck ROM: Limited    Dental  (+) Teeth Intact   Pulmonary neg pulmonary ROS, sleep apnea , former smoker   Pulmonary exam normal breath sounds clear to auscultation       Cardiovascular Exercise Tolerance: Good hypertension, Pt. on medications negative cardio ROS Normal cardiovascular exam Rhythm:Regular Rate:Normal     Neuro/Psych    Depression    negative neurological ROS  negative psych ROS   GI/Hepatic negative GI ROS, Neg liver ROS,GERD  Medicated,,  Endo/Other  negative endocrine ROSdiabetes, Type 2, Oral Hypoglycemic Agents  Morbid obesity  Renal/GU negative Renal ROS  negative genitourinary   Musculoskeletal  (+) Arthritis ,    Abdominal  (+) + obese  Peds negative pediatric ROS (+)  Hematology negative hematology ROS (+)   Anesthesia Other Findings Past Medical History: No date: Adopted No date: Arthritis No date: Borderline diabetes No date: GERD (gastroesophageal reflux disease) No date: Heart murmur     Comment:  as child   no problems No date: Hypertension No date: Pre-diabetes No date: Shortness of breath dyspnea     Comment:  with exertion  No date: Sleep apnea     Comment:  never got cpap   Past Surgical History: 07/30/2015: ANTERIOR CERVICAL DECOMP/DISCECTOMY FUSION; N/A     Comment:  Procedure: C4-5 C5-6 C6-7 Anterior cervical               decompression/diskectomy/fusion;  Surgeon: Maeola Harman,               MD;  Location: [facility];  Service: Neurosurgery;                Laterality: N/A;  C4-5 C5-6 C6-7 Anterior cervical               decompression/diskectomy/fusion No date: BACK SURGERY     Comment:  1980s 03/25/2022: CATARACT EXTRACTION W/PHACO;  Right     Comment:  Procedure: CATARACT EXTRACTION PHACO AND INTRAOCULAR               LENS PLACEMENT (IOC) RIGHT 8.21 01:22.7;  Surgeon:               Lockie Mola, MD;  Location: [facility];              Service: Ophthalmology;  Laterality: Right; No date: EYE SURGERY No date: KNEE ARTHROPLASTY     Comment:  bilat  No date: ROTATOR CUFF REPAIR     Comment:  left  No date: TONSILLECTOMY  BMI    Body Mass Index: 41.14 kg/m      Reproductive/Obstetrics negative OB ROS                             Anesthesia Physical Anesthesia Plan  ASA: 2  Anesthesia Plan: General   Post-op Pain Management:    Induction: Intravenous  PONV Risk Score and Plan: Propofol infusion and TIVA  Airway Management Planned: Natural Airway  Additional Equipment:   Intra-op Plan:   Post-operative Plan:   Informed Consent: I have reviewed the patients History and Physical, chart, labs and discussed the procedure including the risks, benefits and alternatives for the proposed anesthesia with the patient  or authorized representative who has indicated his/her understanding and acceptance.     Dental Advisory Given  Plan Discussed with: CRNA and Surgeon  Anesthesia Plan Comments:        Anesthesia Quick Evaluation

## 2023-03-12 NOTE — Transfer of Care (Signed)
Immediate Anesthesia Transfer of Care Note  Patient: Joel Owens  Procedure(s) Performed: COLONOSCOPY WITH PROPOFOL  Patient Location: Endoscopy Unit  Anesthesia Type:General  Level of Consciousness: drowsy  Airway & Oxygen Therapy: Patient Spontanous Breathing and Patient connected to nasal cannula oxygen  Post-op Assessment: Report given to RN and Post -op Vital signs reviewed and stable  Post vital signs: Reviewed and stable  Last Vitals:  Vitals Value Taken Time  BP 137/82 03/12/23 1020  Temp 35.9 C 03/12/23 1020  Pulse 68 03/12/23 1020  Resp 20 03/12/23 1020  SpO2 96 % 03/12/23 1021  Vitals shown include unvalidated device data.  Last Pain:  Vitals:   03/12/23 1020  TempSrc: Temporal  PainSc:          Complications: No notable events documented.

## 2023-03-16 ENCOUNTER — Encounter: Payer: Self-pay | Admitting: Gastroenterology

## 2023-03-18 LAB — SURGICAL PATHOLOGY

## 2023-05-10 ENCOUNTER — Other Ambulatory Visit: Payer: Self-pay | Admitting: Internal Medicine

## 2023-05-10 DIAGNOSIS — I700 Atherosclerosis of aorta: Secondary | ICD-10-CM

## 2023-05-10 DIAGNOSIS — R079 Chest pain, unspecified: Secondary | ICD-10-CM

## 2023-05-19 ENCOUNTER — Encounter (HOSPITAL_COMMUNITY): Payer: Self-pay

## 2023-05-19 ENCOUNTER — Other Ambulatory Visit (HOSPITAL_COMMUNITY): Payer: Self-pay | Admitting: Emergency Medicine

## 2023-05-19 DIAGNOSIS — R079 Chest pain, unspecified: Secondary | ICD-10-CM

## 2023-05-19 MED ORDER — IVABRADINE HCL 5 MG PO TABS: 15.0000 mg | ORAL_TABLET | Freq: Once | ORAL | 0 refills | Status: AC

## 2023-05-19 MED ORDER — METOPROLOL TARTRATE 100 MG PO TABS: 100.0000 mg | ORAL_TABLET | Freq: Once | ORAL | 0 refills | Status: AC

## 2023-05-21 ENCOUNTER — Telehealth (HOSPITAL_COMMUNITY): Payer: Self-pay | Admitting: Emergency Medicine

## 2023-05-21 NOTE — Telephone Encounter (Signed)
Attempted to call patient regarding upcoming cardiac CT appointment. °Left message on voicemail with name and callback number °Adaley Kiene RN Navigator Cardiac Imaging °[facility] °[phone] Office °[phone] Cell ° °

## 2023-05-24 ENCOUNTER — Ambulatory Visit
Admission: RE | Admit: 2023-05-24 | Discharge: 2023-05-24 | Disposition: A | Discharge status: Home / Self Care | Payer: Medicare Other | Source: Ambulatory Visit | Attending: Internal Medicine | Admitting: Internal Medicine

## 2023-05-24 DIAGNOSIS — R079 Chest pain, unspecified: Secondary | ICD-10-CM | POA: Diagnosis not present

## 2023-05-24 DIAGNOSIS — I700 Atherosclerosis of aorta: Secondary | ICD-10-CM | POA: Insufficient documentation

## 2023-05-24 LAB — POCT I-STAT CREATININE: Creatinine, Ser: 1.2 mg/dL (ref 0.61–1.24)

## 2023-05-24 MED ORDER — IOHEXOL 350 MG/ML SOLN
100.0000 mL | Freq: Once | INTRAVENOUS | Status: AC | PRN
  Administered 2023-05-24: 100 mL via INTRAVENOUS

## 2023-05-24 MED ORDER — NITROGLYCERIN 0.4 MG SL SUBL
0.8000 mg | SUBLINGUAL_TABLET | Freq: Once | SUBLINGUAL | Status: AC
  Administered 2023-05-24: 0.8 mg via SUBLINGUAL
  Filled 2023-05-24: qty 25

## 2023-05-24 NOTE — Progress Notes (Signed)
Patient tolerated procedure well. Ambulate w/o difficulty. Denies any lightheadedness or being dizzy. Pt denies any pain at this time. Sitting in chair. Pt is encouraged to drink additional water throughout the day and reason explained to patient. Patient verbalized understanding and all questions answered. ABC intact. No further needs at this time. Discharge from procedure area w/o issues. 

## 2023-12-05 IMAGING — MR MR LUMBAR SPINE W/O CM
5 series · 31 of 48 positions shown · non-contrast
Comparison: None.

CLINICAL DATA: Neurogenic claudication due to lumbar spinal
stenosis.

EXAM:
MRI LUMBAR SPINE WITHOUT CONTRAST
TECHNIQUE: Multiplanar, multisequence MR imaging of the lumbar spine was
performed. No intravenous contrast was administered.

[Series 5: T2 · sagittal · 4.0mm · 0.81mm/px · 7 of 17 slices shown (1 of 2)]
[im 1/17]
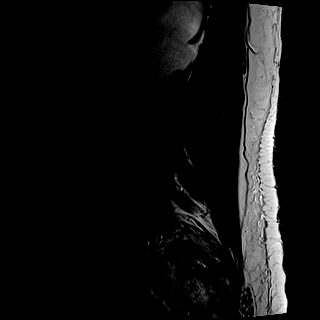
[im 3/17]
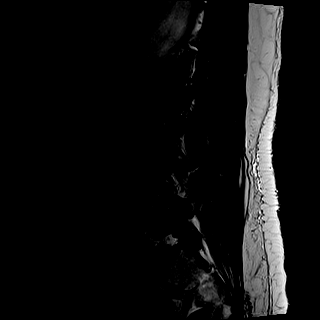
[im 6/17]
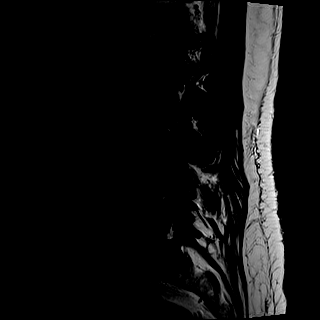
[im 9/17]
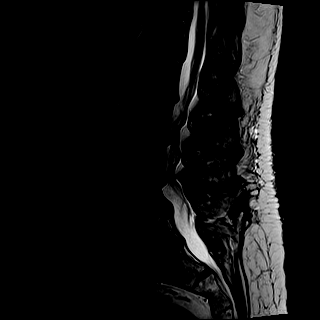
[im 11/17]
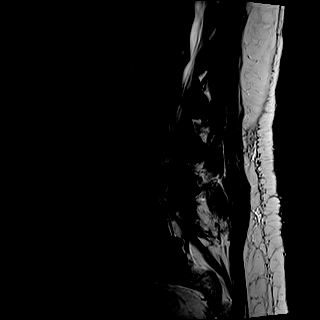
[im 14/17]
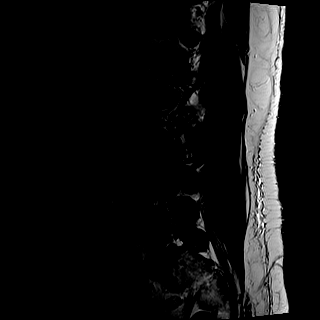
[im 17/17]
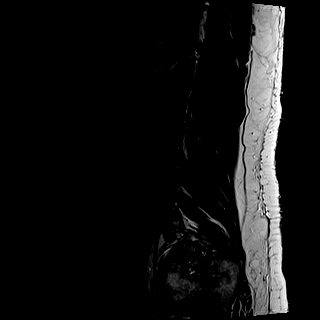

[Series 6: T1 · sagittal · 4.0mm · 0.81mm/px · 7 of 17 slices shown (1 of 2)]
[im 1/17]
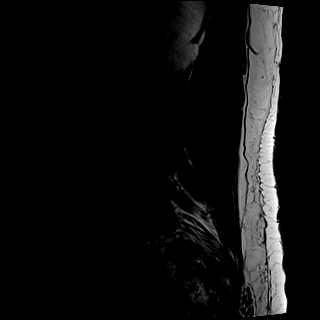
[im 3/17]
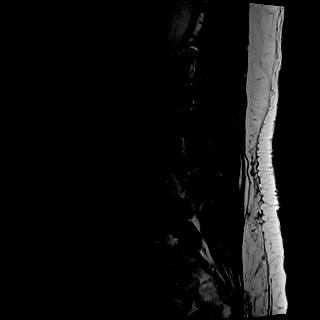
[im 6/17]
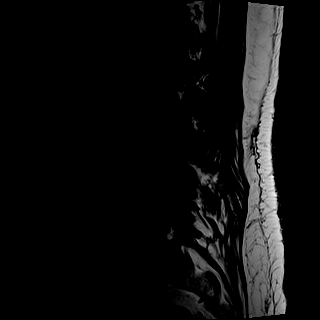
[im 9/17]
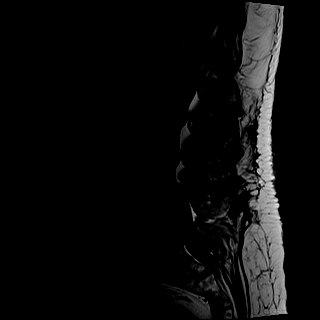
[im 11/17]
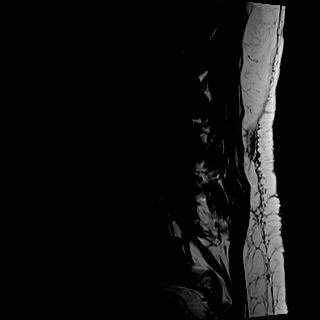
[im 14/17]
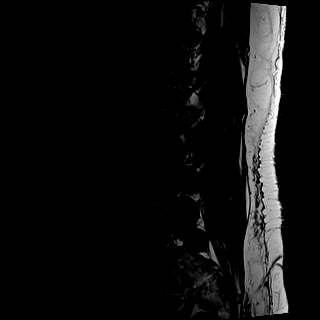
[im 17/17]
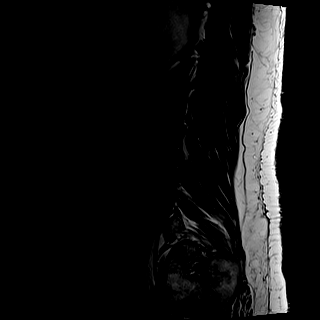

[Series 7: STIR · sagittal · 4.0mm · 0.41mm/px · 1 of 17 slices shown]
[im 1/17]
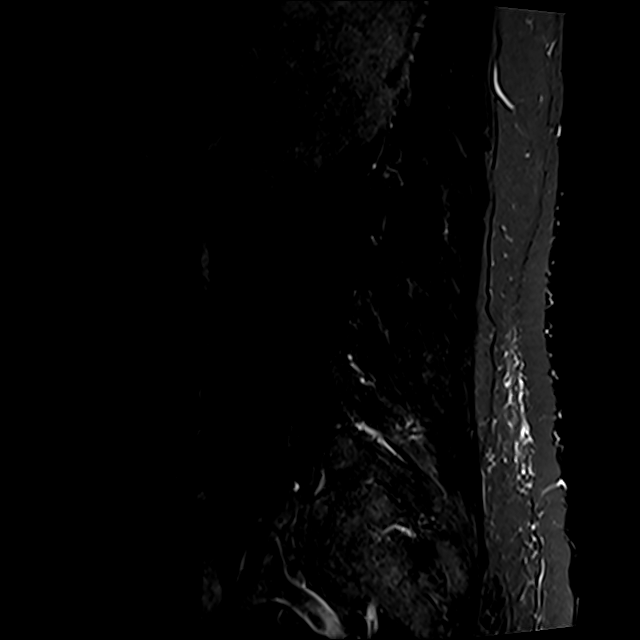

[Series 8: T2 · axial · 4.0mm · 0.78mm/px · z∈[-90,+133]mm · 8 of 38 slices shown (2 of 2)]
[im 1/38]
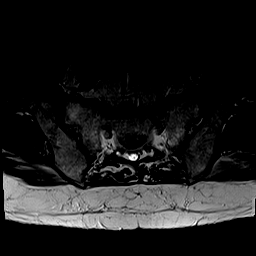
[im 6/38]
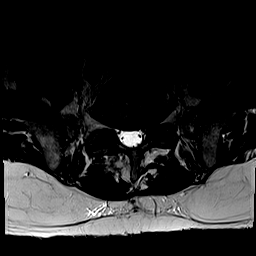
[im 12/38]
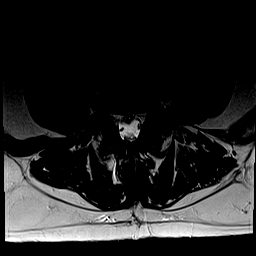
[im 18/38]
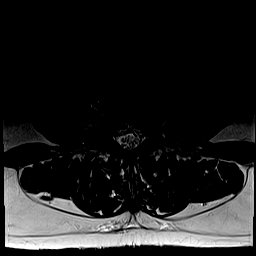
[im 20/38]
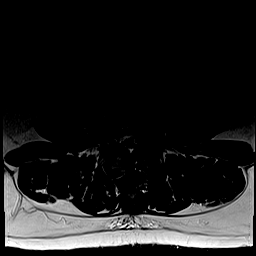
[im 26/38]
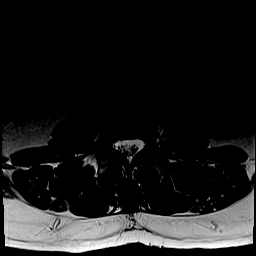
[im 32/38]
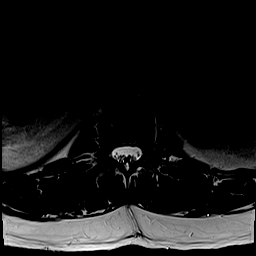
[im 38/38]
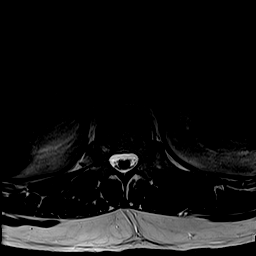

[Series 9: T1 · axial · 4.0mm · 0.39mm/px · z∈[-90,+133]mm · 8 of 38 slices shown (2 of 2)]
[im 1/38]
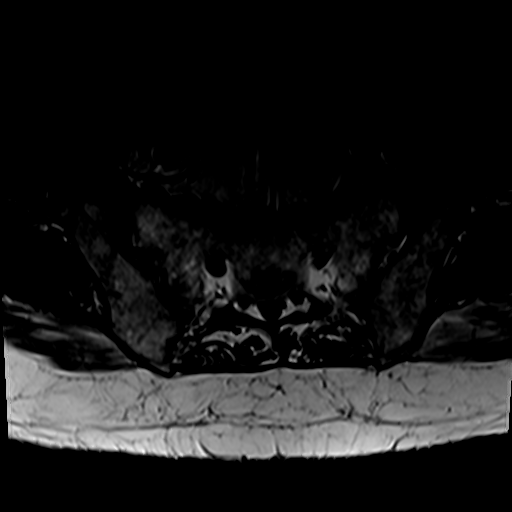
[im 6/38]
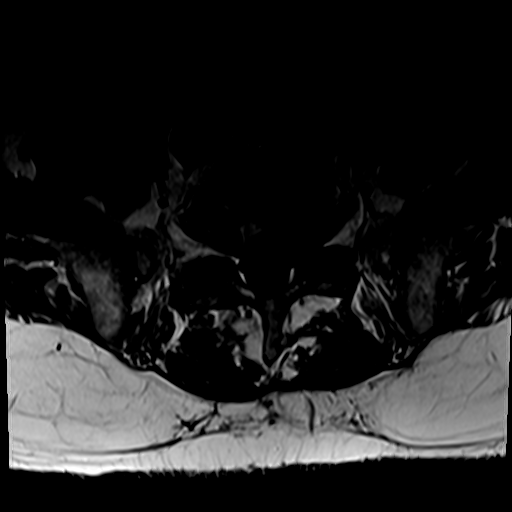
[im 12/38]
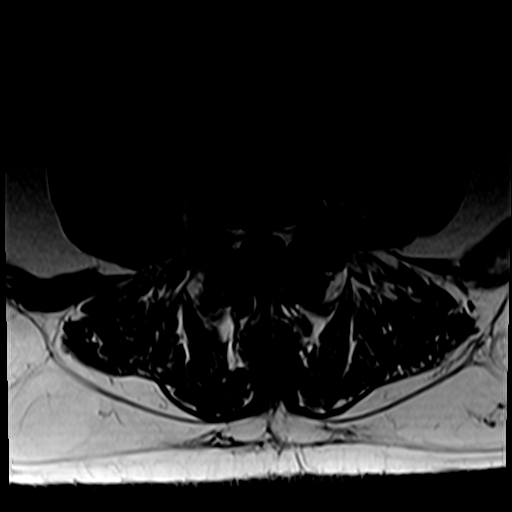
[im 18/38]
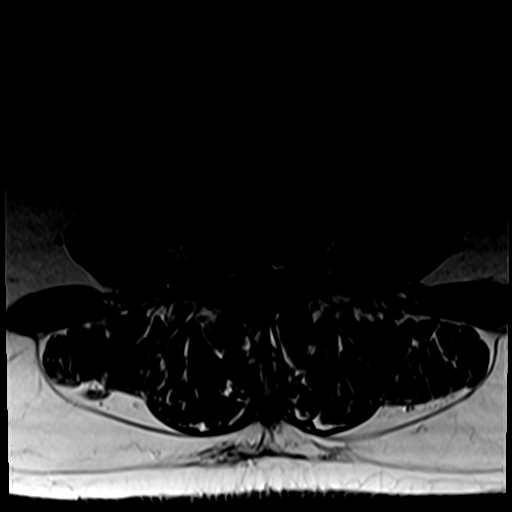
[im 20/38]
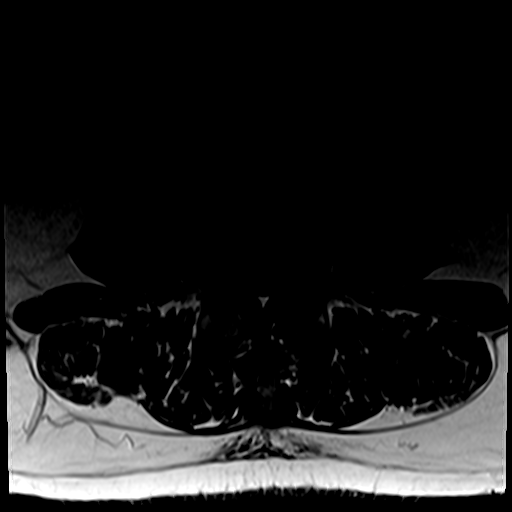
[im 26/38]
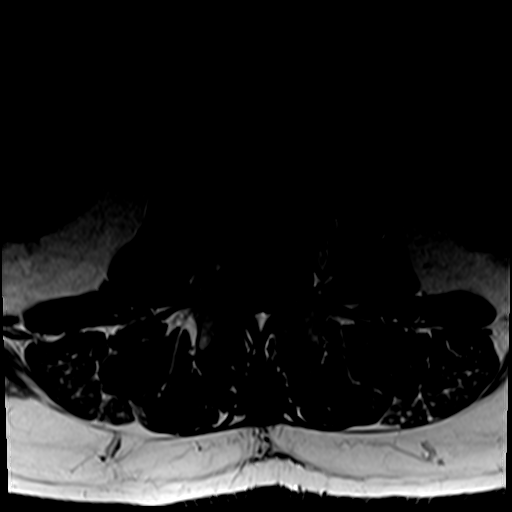
[im 32/38]
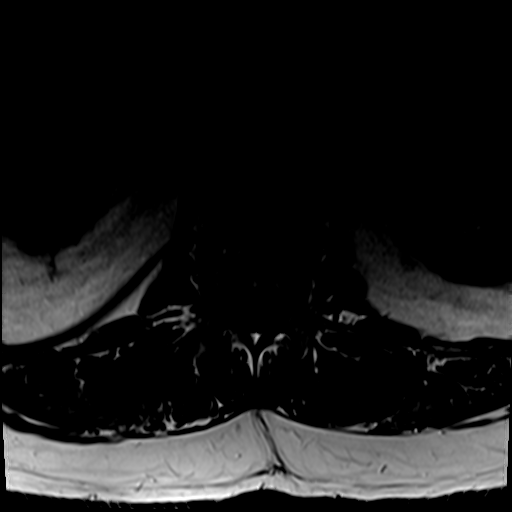
[im 38/38]
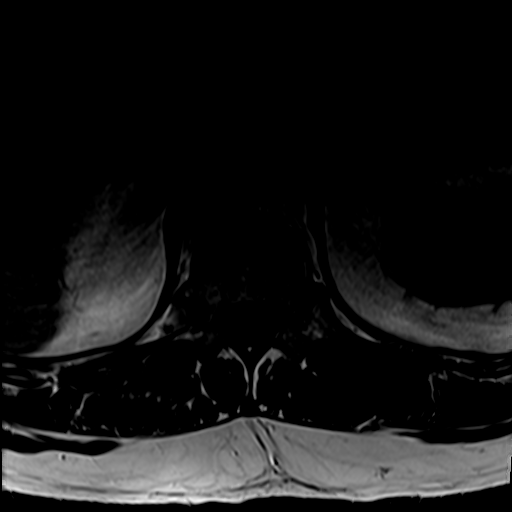

[31 of 48 positions shown; findings below may reference images not displayed]

FINDINGS: Segmentation:  Standard.

Alignment:  Small retrolisthesis of L3 over L4.

Vertebrae: No fracture, evidence of discitis, or bone lesion.
Endplate degenerative changes of the lumbar spine, more pronounced
at L2-3 and L3-4. Postsurgical changes from left laminectomy at
L4-5.

Conus medullaris and cauda equina: Conus extends to the L1 level.
Conus and cauda equina appear normal.

Paraspinal and other soft tissues: A 1.7 cm T1 and T2 hyperintense
right renal lesion, incompletely evaluated, may represent a
hemorrhagic cyst.

Disc levels:

T12-L1: Shallow disc bulge and mild facet degenerative changes
without significant spinal canal or neural foraminal stenosis.

L1-2: Disc bulge, mild facet degenerative changes ligamentum flavum
redundancy resulting in mild bilateral neural foraminal narrowing.
No significant spinal canal stenosis.

L2-3: Disc bulge with superimposed left subarticular inferiorly
migrating disc extrusion, moderate facet degenerative changes,
ligamentum flavum redundancy and epidural lipomatosis. Findings
result in moderate narrowing of the thecal sac with narrowing of the
bilateral subarticular zones, left greater than right,
mild-to-moderate right and mild left neural foraminal narrowing. The
disc extrusion likely impinges on the traversing left L3 nerve root.

L3-4: Loss of disc height, disc bulge with associated osteophytic
component, hypertrophic facet degenerative changes ligamentum flavum
redundancy resulting in moderate spinal canal stenosis with
narrowing of the bilateral subarticular zones and moderate bilateral
neural foraminal narrowing.

L4-5: Loss of disc height, residual disc bulge and moderate facet
degenerative changes resulting in narrowing of the bilateral
subarticular zones and mild-to-moderate bilateral neural foraminal
narrowing.

L5-S1: Disc height, shallow disc bulge with associated osteophytic
component and mild facet degenerative changes resulting in mild
bilateral neural foraminal narrowing, left greater than right.
IMPRESSION: 1. Degenerative changes of the lumbar spine with moderate spinal
canal stenosis at L2-3 and L3-4.
2. Multilevel neural foraminal narrowing, moderate bilaterally at
L3-4 and mild to moderate bilaterally at L4-5.
3. Left subarticular disc extrusion at L2-3 likely impinging on the
traversing left L3 nerve root.

## 2024-01-10 ENCOUNTER — Encounter: Payer: Self-pay | Admitting: Gastroenterology

## 2024-01-20 ENCOUNTER — Encounter: Payer: Self-pay | Admitting: Gastroenterology

## 2024-01-21 ENCOUNTER — Encounter
Admission: RE | Disposition: A | Discharge status: Home / Self Care | Payer: Self-pay | Source: Home / Self Care | Attending: Gastroenterology

## 2024-01-21 ENCOUNTER — Encounter: Payer: Self-pay | Admitting: Gastroenterology

## 2024-01-21 ENCOUNTER — Other Ambulatory Visit: Payer: Self-pay

## 2024-01-21 ENCOUNTER — Ambulatory Visit: Admitting: Anesthesiology

## 2024-01-21 ENCOUNTER — Ambulatory Visit
Admission: RE | Admit: 2024-01-21 | Discharge: 2024-01-21 | Disposition: A | Discharge status: Home / Self Care | Payer: Medicare Other | Attending: Gastroenterology | Admitting: Gastroenterology

## 2024-01-21 DIAGNOSIS — D124 Benign neoplasm of descending colon: Secondary | ICD-10-CM | POA: Insufficient documentation

## 2024-01-21 DIAGNOSIS — K5730 Diverticulosis of large intestine without perforation or abscess without bleeding: Secondary | ICD-10-CM | POA: Diagnosis not present

## 2024-01-21 DIAGNOSIS — K640 First degree hemorrhoids: Secondary | ICD-10-CM | POA: Insufficient documentation

## 2024-01-21 DIAGNOSIS — Z1211 Encounter for screening for malignant neoplasm of colon: Secondary | ICD-10-CM | POA: Diagnosis present

## 2024-01-21 DIAGNOSIS — G4730 Sleep apnea, unspecified: Secondary | ICD-10-CM | POA: Insufficient documentation

## 2024-01-21 DIAGNOSIS — Z7984 Long term (current) use of oral hypoglycemic drugs: Secondary | ICD-10-CM | POA: Insufficient documentation

## 2024-01-21 DIAGNOSIS — I10 Essential (primary) hypertension: Secondary | ICD-10-CM | POA: Insufficient documentation

## 2024-01-21 DIAGNOSIS — K621 Rectal polyp: Secondary | ICD-10-CM | POA: Diagnosis not present

## 2024-01-21 DIAGNOSIS — E119 Type 2 diabetes mellitus without complications: Secondary | ICD-10-CM | POA: Insufficient documentation

## 2024-01-21 DIAGNOSIS — K219 Gastro-esophageal reflux disease without esophagitis: Secondary | ICD-10-CM | POA: Insufficient documentation

## 2024-01-21 DIAGNOSIS — I2510 Atherosclerotic heart disease of native coronary artery without angina pectoris: Secondary | ICD-10-CM | POA: Insufficient documentation

## 2024-01-21 HISTORY — DX: Benign prostatic hyperplasia without lower urinary tract symptoms: N40.0

## 2024-01-21 HISTORY — DX: Diverticulosis of intestine, part unspecified, without perforation or abscess without bleeding: K57.90

## 2024-01-21 HISTORY — DX: Atherosclerosis of aorta: I70.0

## 2024-01-21 HISTORY — DX: Personal history of other diseases of the digestive system: Z87.19

## 2024-01-21 HISTORY — DX: Anesthesia of skin: R20.0

## 2024-01-21 HISTORY — DX: Atherosclerotic heart disease of native coronary artery without angina pectoris: I25.10

## 2024-01-21 HISTORY — DX: Unspecified osteoarthritis, unspecified site: M19.90

## 2024-01-21 HISTORY — DX: Zoster without complications: B02.9

## 2024-01-21 HISTORY — PX: COLONOSCOPY WITH PROPOFOL: SHX5780

## 2024-01-21 LAB — GLUCOSE, CAPILLARY: Glucose-Capillary: 123 mg/dL — ABNORMAL HIGH (ref 70–99)

## 2024-01-21 SURGERY — COLONOSCOPY WITH PROPOFOL
Anesthesia: General

## 2024-01-21 MED ORDER — SODIUM CHLORIDE 0.9 % IV SOLN: INTRAVENOUS | Status: DC

## 2024-01-21 MED ORDER — LIDOCAINE HCL (CARDIAC) PF 100 MG/5ML IV SOSY
PREFILLED_SYRINGE | INTRAVENOUS | Status: DC | PRN
  Administered 2024-01-21: 100 mg via INTRAVENOUS

## 2024-01-21 MED ORDER — PROPOFOL 10 MG/ML IV BOLUS
INTRAVENOUS | Status: DC | PRN
  Administered 2024-01-21: 50 mg via INTRAVENOUS
  Administered 2024-01-21: 30 mg via INTRAVENOUS

## 2024-01-21 MED ORDER — LIDOCAINE HCL (PF) 2 % IJ SOLN
INTRAMUSCULAR | Status: AC
  Filled 2024-01-21: qty 5

## 2024-01-21 MED ORDER — PROPOFOL 500 MG/50ML IV EMUL
INTRAVENOUS | Status: DC | PRN
  Administered 2024-01-21: 50 ug/kg/min via INTRAVENOUS

## 2024-01-21 MED ORDER — DEXMEDETOMIDINE HCL IN NACL 80 MCG/20ML IV SOLN
INTRAVENOUS | Status: DC | PRN
  Administered 2024-01-21: 20 ug via INTRAVENOUS

## 2024-01-21 NOTE — Transfer of Care (Signed)
 Immediate Anesthesia Transfer of Care Note  Patient: Joel Owens  Procedure(s) Performed: COLONOSCOPY WITH PROPOFOL  Patient Location: PACU  Anesthesia Type:General  Level of Consciousness: sedated  Airway & Oxygen Therapy: Patient Spontanous Breathing  Post-op Assessment: Report given to RN and Post -op Vital signs reviewed and stable  Post vital signs: Reviewed and stable  Last Vitals:  Vitals Value Taken Time  BP    Temp 36.1 C 01/21/24 0904  Pulse    Resp    SpO2      Last Pain:  Vitals:   01/21/24 0904  TempSrc:   PainSc: 0-No pain         Complications: No notable events documented.

## 2024-01-21 NOTE — H&P (Signed)
 Pre-Procedure H&P   Patient ID: Joel Owens is a 73 y.o. male.  Gastroenterology Provider: Jaynie Collins, DO  Referring Provider: Jacob Moores, PA PCP: Rayetta Humphrey, MD  Date: 01/21/2024  HPI Mr. Joel Owens is a 73 y.o. male who presents today for Colonoscopy for Personal history of Colon polyps .  Last attempted to undergo colonoscopy in May 2024.  This was incomplete secondary to body habitus, limited prep and colon redundancy.  Left-sided diverticulosis was noted.  1 sessile serrated and 2 tubular adenomas were removed.  Colonoscopy in 2012 demonstrated diverticulosis.  Unknown family history.  Positive FIT testing in December 2023  Bowel movements regular without melena or hematochezia  Creatinine 1.0 hemoglobin 12.7 MCV 91 platelets 218,000   Past Medical History:  Diagnosis Date   Adopted    Arthritis    Atherosclerosis of aorta (HCC)    Borderline diabetes    Coronary artery disease    Degenerative joint disease    Diverticulosis    GERD (gastroesophageal reflux disease)    Heart murmur    as child   no problems   History of diverticulitis    Hypertension    Hypertrophy of prostate without urinary obstruction    Numbness and tingling in both hands    Pre-diabetes    Shingles    Shortness of breath dyspnea    with exertion    Sleep apnea    never got cpap     Past Surgical History:  Procedure Laterality Date   ANTERIOR CERVICAL DECOMP/DISCECTOMY FUSION N/A 07/30/2015   Procedure: C4-5 C5-6 C6-7 Anterior cervical decompression/diskectomy/fusion;  Surgeon: Maeola Harman, MD;  Location: [facility];  Service: Neurosurgery;  Laterality: N/A;  C4-5 C5-6 C6-7 Anterior cervical decompression/diskectomy/fusion   BACK SURGERY     1980s   CATARACT EXTRACTION W/PHACO Right 03/25/2022   Procedure: CATARACT EXTRACTION PHACO AND INTRAOCULAR LENS PLACEMENT (IOC) RIGHT 8.21 01:22.7;  Surgeon: Lockie Mola, MD;  Location: [facility];  Service: Ophthalmology;  Laterality: Right;   COLONOSCOPY WITH PROPOFOL N/A 03/12/2023   Procedure: COLONOSCOPY WITH PROPOFOL;  Surgeon: Jaynie Collins, DO;  Location: [facility] ENDOSCOPY;  Service: Gastroenterology;  Laterality: N/A;   EYE SURGERY     JOINT REPLACEMENT     KNEE ARTHROPLASTY     bilat    ROTATOR CUFF REPAIR     left    TONSILLECTOMY      Family History No h/o GI disease or malignancy  Review of Systems  Constitutional:  Negative for activity change, appetite change, chills, diaphoresis, fatigue, fever and unexpected weight change.  HENT:  Negative for trouble swallowing and voice change.   Respiratory:  Negative for shortness of breath and wheezing.   Cardiovascular:  Negative for chest pain, palpitations and leg swelling.  Gastrointestinal:  Negative for abdominal distention, abdominal pain, anal bleeding, blood in stool, constipation, diarrhea, nausea and vomiting.  Musculoskeletal:  Negative for arthralgias and myalgias.  Skin:  Negative for color change and pallor.  Neurological:  Negative for dizziness, syncope and weakness.  Psychiatric/Behavioral:  Negative for confusion. The patient is not nervous/anxious.   All other systems reviewed and are negative.    Medications No current facility-administered medications on file prior to encounter.   Current Outpatient Medications on File Prior to Encounter  Medication Sig Dispense Refill   amLODipine (NORVASC) 10 MG tablet Take 10 mg by mouth daily.     Cholecalciferol (VITAMIN D3) 125 MCG (5000 UT) TABS  Take by mouth.     esomeprazole (NEXIUM) 40 MG capsule Take 20 mg by mouth daily at 12 noon.     gabapentin (NEURONTIN) 300 MG capsule Take 300 mg by mouth 2 (two) times daily.     glipiZIDE (GLUCOTROL XL) 2.5 MG 24 hr tablet Take 2.5 mg by mouth daily with breakfast.     hydrALAZINE (APRESOLINE) 50 MG tablet Take 50 mg by mouth.     hydrochlorothiazide (HYDRODIURIL) 25 MG tablet Take 25 mg by mouth  daily.     losartan (COZAAR) 100 MG tablet Take 100 mg by mouth daily.      Multiple Vitamins-Minerals (CENTRUM SILVER 50+MEN) TABS Take by mouth.     omeprazole (PRILOSEC) 20 MG capsule Take 20 mg by mouth daily.     tamsulosin (FLOMAX) 0.4 MG CAPS capsule Take 0.4 mg by mouth.     Vitamin E 670 MG (1000 UT) CAPS Take by mouth.     metoprolol tartrate (LOPRESSOR) 100 MG tablet Take 1 tablet (100 mg total) by mouth once for 1 dose. Take 90-120 minutes prior to scan. Hold for SBP less than 110. 1 tablet 0   [DISCONTINUED] FLUoxetine (PROZAC) 20 MG capsule Take 1 capsule (20 mg total) by mouth daily. 30 capsule 0   [DISCONTINUED] metFORMIN (GLUCOPHAGE) 500 MG tablet Take 1 tablet (500 mg total) by mouth 2 (two) times daily with a meal. 60 tablet 0   [DISCONTINUED] traZODone (DESYREL) 150 MG tablet Take 1 tablet (150 mg total) by mouth at bedtime. 30 tablet 0    Pertinent medications related to GI and procedure were reviewed by me with the patient prior to the procedure   Current Facility-Administered Medications:    0.9 %  sodium chloride infusion, , Intravenous, Continuous, Jaynie Collins, DO, Last Rate: 20 mL/hr at 01/21/24 0759, New Bag at 01/21/24 0759  sodium chloride 20 mL/hr at 01/21/24 0759       Allergies  Allergen Reactions   Ace Inhibitors Cough   Amitriptyline Other (See Comments)    DROWSY DURING DAYTIME AND WEIGHT GAIN   Allergies were reviewed by me prior to the procedure  Objective   Body mass index is 41.62 kg/m. Vitals:   01/21/24 0755  BP: (!) 142/64  Pulse: 81  Resp: 12  Temp: (!) 97 F (36.1 C)  TempSrc: Temporal  SpO2: 95%  Weight: 135.4 kg  Height: 5\' 11"  (1.803 m)     Physical Exam Vitals and nursing note reviewed.  Constitutional:      General: He is not in acute distress.    Appearance: Normal appearance. He is obese. He is not ill-appearing, toxic-appearing or diaphoretic.  HENT:     Head: Normocephalic and atraumatic.     Nose:  Nose normal.     Mouth/Throat:     Mouth: Mucous membranes are moist.     Pharynx: Oropharynx is clear.  Eyes:     General: No scleral icterus.    Extraocular Movements: Extraocular movements intact.  Cardiovascular:     Rate and Rhythm: Normal rate and regular rhythm.     Heart sounds: Normal heart sounds. No murmur heard.    No friction rub. No gallop.  Pulmonary:     Effort: Pulmonary effort is normal. No respiratory distress.     Breath sounds: Normal breath sounds. No wheezing, rhonchi or rales.  Abdominal:     General: Bowel sounds are normal. There is no distension.     Palpations: Abdomen is  soft.     Tenderness: There is no abdominal tenderness. There is no guarding or rebound.  Musculoskeletal:     Cervical back: Neck supple.     Right lower leg: No edema.     Left lower leg: No edema.  Skin:    General: Skin is warm and dry.     Coloration: Skin is not jaundiced or pale.  Neurological:     General: No focal deficit present.     Mental Status: He is alert and oriented to person, place, and time. Mental status is at baseline.  Psychiatric:        Mood and Affect: Mood normal.        Behavior: Behavior normal.        Thought Content: Thought content normal.        Judgment: Judgment normal.      Assessment:  Mr. PEARLY APACHITO is a 73 y.o. male  who presents today for Colonoscopy for Personal history of Colon polyps .  Plan:  Colonoscopy with possible intervention today  Colonoscopy with possible biopsy, control of bleeding, polypectomy, and interventions as necessary has been discussed with the patient/patient representative. Informed consent was obtained from the patient/patient representative after explaining the indication, nature, and risks of the procedure including but not limited to death, bleeding, perforation, missed neoplasm/lesions, cardiorespiratory compromise, and reaction to medications. Opportunity for questions was given and appropriate answers  were provided. Patient/patient representative has verbalized understanding is amenable to undergoing the procedure.   Jaynie Collins, DO  [facility] Gastroenterology  Portions of the record may have been created with voice recognition software. Occasional wrong-word or 'sound-a-like' substitutions may have occurred due to the inherent limitations of voice recognition software.  Read the chart carefully and recognize, using context, where substitutions may have occurred.

## 2024-01-21 NOTE — Anesthesia Preprocedure Evaluation (Signed)
 Anesthesia Evaluation  Patient identified by MRN, date of birth, ID band Patient awake    Reviewed: Allergy & Precautions, NPO status , Patient's Chart, lab work & pertinent test results  History of Anesthesia Complications Negative for: history of anesthetic complications  Airway Mallampati: III  TM Distance: <3 FB Neck ROM: full    Dental  (+) Chipped, Poor Dentition, Missing   Pulmonary neg shortness of breath, sleep apnea , former smoker   Pulmonary exam normal        Cardiovascular Exercise Tolerance: Good hypertension, (-) angina + CAD  Normal cardiovascular exam     Neuro/Psych negative neurological ROS  negative psych ROS   GI/Hepatic Neg liver ROS,GERD  Controlled,,  Endo/Other  diabetes    Renal/GU negative Renal ROS  negative genitourinary   Musculoskeletal   Abdominal   Peds  Hematology negative hematology ROS (+)   Anesthesia Other Findings Past Medical History: No date: Adopted No date: Arthritis No date: Atherosclerosis of aorta (HCC) No date: Borderline diabetes No date: Coronary artery disease No date: Degenerative joint disease No date: Diverticulosis No date: GERD (gastroesophageal reflux disease) No date: Heart murmur     Comment:  as child   no problems No date: History of diverticulitis No date: Hypertension No date: Hypertrophy of prostate without urinary obstruction No date: Numbness and tingling in both hands No date: Pre-diabetes No date: Shingles No date: Shortness of breath dyspnea     Comment:  with exertion  No date: Sleep apnea     Comment:  never got cpap   Past Surgical History: 07/30/2015: ANTERIOR CERVICAL DECOMP/DISCECTOMY FUSION; N/A     Comment:  Procedure: C4-5 C5-6 C6-7 Anterior cervical               decompression/diskectomy/fusion;  Surgeon: Maeola Harman,               MD;  Location: [facility];  Service: Neurosurgery;                Laterality: N/A;   C4-5 C5-6 C6-7 Anterior cervical               decompression/diskectomy/fusion No date: BACK SURGERY     Comment:  1980s 03/25/2022: CATARACT EXTRACTION W/PHACO; Right     Comment:  Procedure: CATARACT EXTRACTION PHACO AND INTRAOCULAR               LENS PLACEMENT (IOC) RIGHT 8.21 01:22.7;  Surgeon:               Lockie Mola, MD;  Location: [facility];              Service: Ophthalmology;  Laterality: Right; 03/12/2023: COLONOSCOPY WITH PROPOFOL; N/A     Comment:  Procedure: COLONOSCOPY WITH PROPOFOL;  Surgeon: Jaynie Collins, DO;  Location: [facility] ENDOSCOPY;  Service:               Gastroenterology;  Laterality: N/A; No date: EYE SURGERY No date: JOINT REPLACEMENT No date: KNEE ARTHROPLASTY     Comment:  bilat  No date: ROTATOR CUFF REPAIR     Comment:  left  No date: TONSILLECTOMY  BMI    Body Mass Index: 41.62 kg/m      Reproductive/Obstetrics negative OB ROS  Anesthesia Physical Anesthesia Plan  ASA: 3  Anesthesia Plan: General   Post-op Pain Management:    Induction: Intravenous  PONV Risk Score and Plan: Propofol infusion and TIVA  Airway Management Planned: Natural Airway and Nasal Cannula  Additional Equipment:   Intra-op Plan:   Post-operative Plan:   Informed Consent: I have reviewed the patients History and Physical, chart, labs and discussed the procedure including the risks, benefits and alternatives for the proposed anesthesia with the patient or authorized representative who has indicated his/her understanding and acceptance.     Dental Advisory Given  Plan Discussed with: Anesthesiologist, CRNA and Surgeon  Anesthesia Plan Comments: (Patient consented for risks of anesthesia including but not limited to:  - adverse reactions to medications - risk of airway placement if required - damage to eyes, teeth, lips or other oral mucosa - nerve damage due to  positioning  - sore throat or hoarseness - Damage to heart, brain, nerves, lungs, other parts of body or loss of life  Patient voiced understanding and assent.)       Anesthesia Quick Evaluation

## 2024-01-21 NOTE — Anesthesia Postprocedure Evaluation (Signed)
 Anesthesia Post Note  Patient: Joel Owens  Procedure(s) Performed: COLONOSCOPY WITH PROPOFOL  Patient location during evaluation: PACU Anesthesia Type: General Level of consciousness: awake and alert Pain management: pain level controlled Vital Signs Assessment: post-procedure vital signs reviewed and stable Respiratory status: spontaneous breathing, nonlabored ventilation, respiratory function stable and patient connected to nasal cannula oxygen Cardiovascular status: blood pressure returned to baseline and stable Postop Assessment: no apparent nausea or vomiting Anesthetic complications: no   No notable events documented.   Last Vitals:  Vitals:   01/21/24 0914 01/21/24 0924  BP: (!) 112/53 (!) 98/56  Pulse: 67 71  Resp: 17 16  Temp: (!) 36.1 C   SpO2: 95% 96%    Last Pain:  Vitals:   01/21/24 0924  TempSrc:   PainSc: 0-No pain                 Cleda Mccreedy Nazaire Cordial

## 2024-01-21 NOTE — Interval H&P Note (Signed)
 History and Physical Interval Note: Preprocedure H&P from 01/21/24  was reviewed and there was no interval change after seeing and examining the patient.  Written consent was obtained from the patient after discussion of risks, benefits, and alternatives. Patient has consented to proceed with Colonoscopy with possible intervention   01/21/2024 8:16 AM  Joel Owens  has presented today for surgery, with the diagnosis of Z86.0101 (ICD-10-CM) - Hx of adenomatous colonic polyps.  The various methods of treatment have been discussed with the patient and family. After consideration of risks, benefits and other options for treatment, the patient has consented to  Procedure(s) with comments: COLONOSCOPY WITH PROPOFOL (N/A) - DM as a surgical intervention.  The patient's history has been reviewed, patient examined, no change in status, stable for surgery.  I have reviewed the patient's chart and labs.  Questions were answered to the patient's satisfaction.     Jaynie Collins

## 2024-01-21 NOTE — Op Note (Signed)
 [facility] Gastroenterology Patient Name: Joel Owens Procedure Date: 01/21/2024 8:18 AM MRN: [id number] Account #: [id number] Date of Birth: Oct 16, 1951 Admit Type: Outpatient Age: 73 Room: [facility] ENDO ROOM 1 Gender: Male Note Status: Finalized Instrument Name: Colonoscope [id number] Procedure:             Colonoscopy Indications:           High risk colon cancer surveillance: Personal history                         of colonic polyps Providers:             Jaynie Collins DO, DO Referring MD:          Marylin Crosby. Greggory Stallion MD, MD (Referring MD) Medicines:             Monitored Anesthesia Care Complications:         No immediate complications. Estimated blood loss:                         Minimal. Procedure:             Pre-Anesthesia Assessment:                        - Prior to the procedure, a History and Physical was                         performed, and patient medications and allergies were                         reviewed. The patient is competent. The risks and                         benefits of the procedure and the sedation options and                         risks were discussed with the patient. All questions                         were answered and informed consent was obtained.                         Patient identification and proposed procedure were                         verified by the physician, the nurse, the anesthetist                         and the technician in the endoscopy suite. Mental                         Status Examination: alert and oriented. Airway                         Examination: normal oropharyngeal airway and neck                         mobility. Respiratory Examination: clear to  auscultation. CV Examination: RRR, no murmurs, no S3                         or S4. Prophylactic Antibiotics: The patient does not                         require prophylactic antibiotics. Prior                          Anticoagulants: The patient has taken no anticoagulant                         or antiplatelet agents. ASA Grade Assessment: III - A                         patient with severe systemic disease. After reviewing                         the risks and benefits, the patient was deemed in                         satisfactory condition to undergo the procedure. The                         anesthesia plan was to use monitored anesthesia care                         (MAC). Immediately prior to administration of                         medications, the patient was re-assessed for adequacy                         to receive sedatives. The heart rate, respiratory                         rate, oxygen saturations, blood pressure, adequacy of                         pulmonary ventilation, and response to care were                         monitored throughout the procedure. The physical                         status of the patient was re-assessed after the                         procedure.                        After obtaining informed consent, the colonoscope was                         passed under direct vision. Throughout the procedure,                         the patient's blood pressure, pulse, and oxygen  saturations were monitored continuously. The                         Colonoscope was introduced through the anus and                         advanced to the the hepatic flexure. The colonoscopy                         was performed with difficulty due to multiple                         diverticula in the colon, a redundant colon,                         significant looping and the patient's body habitus.                         Successful completion of the procedure was aided by                         straightening and shortening the scope to obtain bowel                         loop reduction, using scope torsion, applying                         abdominal pressure,  lavage, managing the patient's                         medical instability and receiving assistance from                         additional staff. The patient tolerated the procedure                         well. The quality of the bowel preparation was good.                         The rectum was photographed. Findings:      The perianal and digital rectal examinations were normal. Pertinent       negatives include normal sphincter tone.      Non-bleeding internal hemorrhoids were found during retroflexion. The       hemorrhoids were Grade I (internal hemorrhoids that do not prolapse).       Estimated blood loss: none.      Multiple small-mouthed diverticula were found in the left colon.       Estimated blood loss: none.      Two sessile polyps were found in the rectum and descending colon. The       polyps were 1 to 2 mm in size. These polyps were removed with a jumbo       cold forceps. Resection and retrieval were complete. Estimated blood       loss was minimal.      An 8 to 9 mm polyp was found in the descending colon. The polyp was       sessile. The polyp was removed with a cold snare. Resection and  retrieval were complete. Estimated blood loss was minimal. Estimated       blood loss was minimal.      The exam was otherwise without abnormality on direct and retroflexion       views. Impression:            - Non-bleeding internal hemorrhoids.                        - Diverticulosis in the left colon.                        - Two 1 to 2 mm polyps in the rectum and in the                         descending colon, removed with a jumbo cold forceps.                         Resected and retrieved.                        - One 8 to 9 mm polyp in the descending colon, removed                         with a cold snare. Resected and retrieved.                        - The examination was otherwise normal on direct and                         retroflexion views. Recommendation:         - Patient has a contact number available for                         emergencies. The signs and symptoms of potential                         delayed complications were discussed with the patient.                         Return to normal activities tomorrow. Written                         discharge instructions were provided to the patient.                        - Discharge patient to home.                        - Resume previous diet.                        - Continue present medications.                        - Await pathology results.                        - No ibuprofen, naproxen, or other non-steroidal  anti-inflammatory drugs for 5 days after polyp removal.                        - - Referral to [facility] for                         reattempt at colonoscopy- recommend with colowrap in                         attempt to evaluate the right colon.                        CT colonography would likely Not be beneficial due to                         history of suboptimal prep and personal history of                         polyps                        - Return to referring physician as previously                         scheduled.                        - The findings and recommendations were discussed with                         the patient. Procedure Code(s):     --- Professional ---                        [phone], 52, Colonoscopy, flexible; with removal of                         tumor(s), polyp(s), or other lesion(s) by snare                         technique                        [id number], 59,52, Colonoscopy, flexible; with biopsy,                         single or multiple Diagnosis Code(s):     --- Professional ---                        Z86.010, Personal history of colonic polyps                        K64.0, First degree hemorrhoids                        D12.8, Benign neoplasm of rectum                        D12.4, Benign  neoplasm of descending colon                        K57.30,  Diverticulosis of large intestine without                         perforation or abscess without bleeding CPT copyright 2022 [employer] Association. All rights reserved. The codes documented in this report are preliminary and upon coder review may  be revised to meet current compliance requirements. Attending Participation:      I personally performed the entire procedure. Elfredia Nevins, DO Jaynie Collins DO, DO 01/21/2024 9:07:30 AM This report has been signed electronically. Number of Addenda: 0 Note Initiated On: 01/21/2024 8:18 AM Total Procedure Duration: 0 hours 34 minutes 43 seconds  Estimated Blood Loss:  Estimated blood loss was minimal.      [facility]

## 2024-01-24 ENCOUNTER — Encounter: Payer: Self-pay | Admitting: Gastroenterology

## 2024-01-24 LAB — SURGICAL PATHOLOGY

## 2024-02-17 ENCOUNTER — Emergency Department

## 2024-02-17 ENCOUNTER — Emergency Department
Admission: EM | Admit: 2024-02-17 | Discharge: 2024-02-17 | Disposition: A | Discharge status: Home / Self Care | Attending: Emergency Medicine | Admitting: Emergency Medicine

## 2024-02-17 ENCOUNTER — Other Ambulatory Visit: Payer: Self-pay

## 2024-02-17 DIAGNOSIS — S0990XA Unspecified injury of head, initial encounter: Secondary | ICD-10-CM | POA: Insufficient documentation

## 2024-02-17 DIAGNOSIS — E119 Type 2 diabetes mellitus without complications: Secondary | ICD-10-CM | POA: Insufficient documentation

## 2024-02-17 DIAGNOSIS — M25512 Pain in left shoulder: Secondary | ICD-10-CM | POA: Insufficient documentation

## 2024-02-17 DIAGNOSIS — M542 Cervicalgia: Secondary | ICD-10-CM | POA: Diagnosis present

## 2024-02-17 DIAGNOSIS — Y92410 Unspecified street and highway as the place of occurrence of the external cause: Secondary | ICD-10-CM | POA: Diagnosis not present

## 2024-02-17 DIAGNOSIS — M79601 Pain in right arm: Secondary | ICD-10-CM | POA: Insufficient documentation

## 2024-02-17 DIAGNOSIS — M25511 Pain in right shoulder: Secondary | ICD-10-CM | POA: Diagnosis not present

## 2024-02-17 DIAGNOSIS — I2510 Atherosclerotic heart disease of native coronary artery without angina pectoris: Secondary | ICD-10-CM | POA: Diagnosis not present

## 2024-02-17 DIAGNOSIS — I10 Essential (primary) hypertension: Secondary | ICD-10-CM | POA: Insufficient documentation

## 2024-02-17 DIAGNOSIS — M25532 Pain in left wrist: Secondary | ICD-10-CM | POA: Diagnosis not present

## 2024-02-17 MED ORDER — METHOCARBAMOL 500 MG PO TABS: 1000.0000 mg | ORAL_TABLET | Freq: Three times a day (TID) | ORAL | 0 refills | Status: DC

## 2024-02-17 MED ORDER — METHOCARBAMOL 500 MG PO TABS
1000.0000 mg | ORAL_TABLET | Freq: Three times a day (TID) | ORAL | 0 refills | Status: AC
Start: 2024-02-17 — End: 2024-02-24

## 2024-02-17 MED ORDER — METHOCARBAMOL 500 MG PO TABS
1000.0000 mg | ORAL_TABLET | Freq: Once | ORAL | Status: AC
  Administered 2024-02-17: 1000 mg via ORAL
  Filled 2024-02-17: qty 2
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
## 2024-02-17 NOTE — Discharge Instructions (Signed)
 Please take previously prescribed pain medications as needed.  Return to the emergency department with any new or worsening symptoms.  Please follow-up with your primary care provider in regards to the gabapentin dosage.

## 2024-06-16 ENCOUNTER — Other Ambulatory Visit: Payer: Self-pay | Admitting: Family Medicine

## 2024-06-16 ENCOUNTER — Ambulatory Visit
Admission: RE | Admit: 2024-06-16 | Discharge: 2024-06-16 | Disposition: A | Discharge status: Home / Self Care | Attending: Family Medicine | Admitting: Family Medicine

## 2024-06-16 ENCOUNTER — Ambulatory Visit
Admission: RE | Admit: 2024-06-16 | Discharge: 2024-06-16 | Disposition: A | Discharge status: Home / Self Care | Source: Ambulatory Visit | Attending: Family Medicine | Admitting: Family Medicine

## 2024-06-16 DIAGNOSIS — M25531 Pain in right wrist: Secondary | ICD-10-CM | POA: Insufficient documentation
